# Patient Record
Sex: Female | Born: 1970 | Race: Black or African American | Hispanic: No | Marital: Single | Smoking: Current every day smoker
Health system: Southern US, Community
[De-identification: ages and names within clinical notes are randomized; demographics above are authoritative.]

## PROBLEM LIST (undated history)

## (undated) DIAGNOSIS — R87619 Unspecified abnormal cytological findings in specimens from cervix uteri: Secondary | ICD-10-CM

## (undated) DIAGNOSIS — R32 Unspecified urinary incontinence: Secondary | ICD-10-CM

## (undated) DIAGNOSIS — N946 Dysmenorrhea, unspecified: Secondary | ICD-10-CM

## (undated) DIAGNOSIS — E079 Disorder of thyroid, unspecified: Secondary | ICD-10-CM

## (undated) DIAGNOSIS — Z5189 Encounter for other specified aftercare: Secondary | ICD-10-CM

## (undated) DIAGNOSIS — G43909 Migraine, unspecified, not intractable, without status migrainosus: Secondary | ICD-10-CM

## (undated) HISTORY — PX: TONSILLECTOMY: SUR1361

## (undated) HISTORY — PX: COLPOSCOPY: SHX161

## (undated) HISTORY — DX: Migraine, unspecified, not intractable, without status migrainosus: G43.909

## (undated) HISTORY — DX: Unspecified abnormal cytological findings in specimens from cervix uteri: R87.619

## (undated) HISTORY — DX: Encounter for other specified aftercare: Z51.89

## (undated) HISTORY — PX: TUBAL LIGATION: SHX77

## (undated) HISTORY — DX: Unspecified urinary incontinence: R32

## (undated) HISTORY — DX: Disorder of thyroid, unspecified: E07.9

## (undated) HISTORY — DX: Dysmenorrhea, unspecified: N94.6

## (undated) HISTORY — PX: DILATION AND CURETTAGE OF UTERUS: SHX78

---
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
## 1998-08-07 HISTORY — PX: WISDOM TOOTH EXTRACTION: SHX21

## 1999-04-24 ENCOUNTER — Emergency Department (HOSPITAL_COMMUNITY): Admission: EM | Admit: 1999-04-24 | Discharge: 1999-04-24 | Payer: Self-pay | Admitting: Emergency Medicine

## 2001-02-10 ENCOUNTER — Encounter: Payer: Self-pay | Admitting: Obstetrics

## 2001-02-10 ENCOUNTER — Ambulatory Visit (HOSPITAL_COMMUNITY): Admission: AD | Admit: 2001-02-10 | Discharge: 2001-02-10 | Payer: Self-pay | Admitting: *Deleted

## 2001-02-10 ENCOUNTER — Encounter (INDEPENDENT_AMBULATORY_CARE_PROVIDER_SITE_OTHER): Payer: Self-pay | Admitting: Specialist

## 2001-03-29 ENCOUNTER — Emergency Department (HOSPITAL_COMMUNITY): Admission: EM | Admit: 2001-03-29 | Discharge: 2001-03-29 | Payer: Self-pay

## 2002-03-10 ENCOUNTER — Emergency Department (HOSPITAL_COMMUNITY): Admission: EM | Admit: 2002-03-10 | Discharge: 2002-03-10 | Payer: Self-pay | Admitting: Emergency Medicine

## 2002-03-10 ENCOUNTER — Encounter: Payer: Self-pay | Admitting: Emergency Medicine

## 2002-05-06 ENCOUNTER — Encounter: Payer: Self-pay | Admitting: Emergency Medicine

## 2002-05-06 ENCOUNTER — Emergency Department (HOSPITAL_COMMUNITY): Admission: EM | Admit: 2002-05-06 | Discharge: 2002-05-06 | Payer: Self-pay | Admitting: Emergency Medicine

## 2003-12-03 ENCOUNTER — Ambulatory Visit (HOSPITAL_COMMUNITY): Admission: RE | Admit: 2003-12-03 | Discharge: 2003-12-03 | Payer: Self-pay | Admitting: Obstetrics & Gynecology

## 2004-10-25 ENCOUNTER — Emergency Department (HOSPITAL_COMMUNITY): Admission: EM | Admit: 2004-10-25 | Discharge: 2004-10-25 | Payer: Self-pay | Admitting: Emergency Medicine

## 2006-01-28 ENCOUNTER — Emergency Department (HOSPITAL_COMMUNITY): Admission: EM | Admit: 2006-01-28 | Discharge: 2006-01-28 | Payer: Self-pay | Admitting: Family Medicine

## 2006-01-31 ENCOUNTER — Inpatient Hospital Stay (HOSPITAL_COMMUNITY): Admission: EM | Admit: 2006-01-31 | Discharge: 2006-02-03 | Payer: Self-pay | Admitting: Family Medicine

## 2006-02-04 ENCOUNTER — Emergency Department (HOSPITAL_COMMUNITY): Admission: EM | Admit: 2006-02-04 | Discharge: 2006-02-04 | Payer: Self-pay | Admitting: Family Medicine

## 2007-11-16 ENCOUNTER — Emergency Department (HOSPITAL_COMMUNITY): Admission: EM | Admit: 2007-11-16 | Discharge: 2007-11-16 | Payer: Self-pay | Admitting: Emergency Medicine

## 2008-01-27 ENCOUNTER — Ambulatory Visit (HOSPITAL_COMMUNITY): Admission: RE | Admit: 2008-01-27 | Discharge: 2008-01-27 | Payer: Self-pay | Admitting: Obstetrics and Gynecology

## 2009-10-21 ENCOUNTER — Emergency Department (HOSPITAL_COMMUNITY): Admission: EM | Admit: 2009-10-21 | Discharge: 2009-10-21 | Payer: Self-pay | Admitting: Family Medicine

## 2009-11-30 ENCOUNTER — Ambulatory Visit: Payer: Self-pay | Admitting: Family Medicine

## 2009-11-30 ENCOUNTER — Telehealth (INDEPENDENT_AMBULATORY_CARE_PROVIDER_SITE_OTHER): Payer: Self-pay | Admitting: *Deleted

## 2010-09-06 NOTE — Progress Notes (Signed)
Summary: no show for new pt appt  Phone Note Outgoing Call   Summary of Call: no show for new pt appt Initial call taken by: Raechel Ache, RN,  November 30, 2009 1:58 PM  Follow-up for Phone Call        please charge the NS fee Follow-up by: Nelwyn Salisbury MD,  December 01, 2009 8:27 AM  Additional Follow-up for Phone Call Additional follow up Details #1::        Billed no show.Melissa Hubbard  Dec 27, 2009 9:19 AM

## 2010-12-23 NOTE — Discharge Summary (Signed)
NAME:  Melissa Hubbard, Melissa Hubbard            ACCOUNT NO.:  [id number]   MEDICAL RECORD NO.:  [id number]          PATIENT TYPE:  INP   LOCATION:  6712                         FACILITY:  [facility]   PHYSICIAN:  Deirdre Peer. Polite, M.D. DATE OF BIRTH:  July 19, 1971   DATE OF ADMISSION:  01/31/2006  DATE OF DISCHARGE:  02/03/2006                                 DISCHARGE SUMMARY   DISCHARGE DIAGNOSES:  1.  Left foot cellulitis with lymphangitis, improved post intravenous      antibiotics.  Will continue Levaquin at home.  2.  Hives, rule out penicillin allergy, improved post Benadryl.  3.  Obesity.   DISCHARGE MEDICATIONS:  1.  Levaquin 500 mg daily x 7 days.  2.  Benadryl 25 mg q.6h. p.r.n. hives.   DISPOSITION:  The patient is being discharged to home in stable condition.  Is to follow up with primary MD in 1 week.   STUDIES:  Blood cultures negative x 2, CBC and BMP within normal limits.  TSH within normal limits.   HISTORY OF PRESENT ILLNESS:  This is a 40 year old female who failed  outpatient treatment for localized cellulitis of the left foot around the  great toe, where she has a blister.  The patient had been treated with Cipro  as an outpatient.  Despite Cipro, symptoms went on to worsen.  The patient  was directly admitted to the hospital for further evaluation and treatment  because of the lymphangitic spread.  Please see dictated H and P for further  details.   PAST MEDICAL HISTORY:  As stated above.   MEDICATIONS ON ADMISSION:  Per admission H and P.   SOCIAL HISTORY:  Positive for 10 cigarettes a day.  No alcohol, no drugs.   FAMILY HISTORY:  Noncontributory.   HOSPITAL COURSE:  The patient was admitted to a medicine floor for  evaluation and treatment of cellulitis with lymphangitic spread to the left  leg.  The patient was started empirically on antibiotics with vancomycin and  Zosyn.  The patient had labs drawn.  CBC was within normal limits.  Blood  cultures negative to  date.  The patient remained without fever or  leukocytosis throughout this hospitalization.  The patient had slow  resolution over the initial 24 hours with rapid and nearly complete response  on today's date, June 30.  The patient did have complications with hives  which seemed to happen after her Zosyn had been infused.  The patient was  given Benadryl with rapid response of hives.  The patient was without  shortness of breath or wheeze, and no obvious hives at this time.  Clinically, erythema has nearly completely resolved.  There is no tenderness  or pain of the medial aspect of her leg,  which had been present prior to admission.  There is no ulcer or discharge  from the patient's great toe where her blister had been.  At this time, the  patient is clinically stable for discharge to home.  The patient has been  made aware of POSSIBLE PENICILLIN ALLERGY.      Deirdre Peer. Polite, M.D.  Electronically  Signed     RDP/MEDQ  D:  02/03/2006  T:  02/03/2006  Job:  [id number]

## 2010-12-23 NOTE — Op Note (Signed)
NAME:  Melissa Hubbard, Melissa Hubbard                      ACCOUNT NO.:  [id number]   MEDICAL RECORD NO.:  [id number]                   PATIENT TYPE:  AMB   LOCATION:  SDC                                  FACILITY:  [facility]   PHYSICIAN:  Roseanna Rainbow, M.D.         DATE OF BIRTH:  Dec 30, 1970   DATE OF PROCEDURE:  12/03/2003  DATE OF DISCHARGE:                                 OPERATIVE REPORT   PREOPERATIVE DIAGNOSIS:  Multiparity, desires sterilization procedure.   POSTOPERATIVE DIAGNOSIS:  Multiparity, desires sterilization procedure.   PROCEDURE:  Laparoscopic bilateral tubal ligation with bipolar cautery.   SURGEON:  Roseanna Rainbow, M.D.   ANESTHESIA:  General endotracheal anesthesia.   COMPLICATIONS:  None.   ESTIMATED BLOOD LOSS:  Less than 50 mL.   IV FLUID:  As per anesthesiology.   URINE OUTPUT:  200 mL at the beginning of the procedure.   DESCRIPTION OF PROCEDURE:  The patient was taken to the operating room with  an V running.  She was given general anesthesia and then prepped and draped  in the dorsal lithotomy position in the usual sterile fashion.  An  infraumbilical skin incision  was then made with the scalpel.  The Veress  needle was then introduced into the peritoneal cavity at a 45 degree angle  while tenting up the abdominal wall.  Intra-abdominal placement was  confirmed by saline drop test and appropriate CO2 pressure.  The abdomen was  then insufflated with CO2 gas.  The trocar was then advanced into the  peritoneal cavity where intra-abdominal placement was confirmed by the  laparoscope.  The mid isthmic portion of the right fallopian tube was then  cauterized continuously.  With each application, ohmmeter was noted to go to  zero.  The left fallopian tube was manipulated in a similar fashion.  The  survey of the pelvic viscera demonstrated a normal anatomy.  All the  instruments were then removed from the abdomen.  The skin was reapproximated  with 3-0  Vicryl and Dermabond.  The instruments were removed from the  vagina.   Please note that prior to the incision, a bivalve speculum was placed in the  patient's vagina.  The anterior lip of the cervix was grasped with a single-  tooth tenaculum and a Hulka probe and tenaculum were advanced into the  uterine cavity and secured to the anterior lip of the cervix as a means to  manipulate the uterus.  At the close of the procedure, the instrument and  pad counts were said to be correct x2.  The patient was taken to the PACU  awake and in stable condition.                                              Roseanna Rainbow, M.D.   Judee Clara  D:  12/03/2003  T:  12/03/2003  Job:  [id number]

## 2010-12-23 NOTE — Op Note (Signed)
[facility]  Patient:    Melissa Hubbard, CAHN                     MRN: [id number] Proc. Date: 02/11/01 Adm. Date:  [id number] Attending:  Tammi Sou CC:         [facility]   Operative Report  PREOPERATIVE DIAGNOSIS:       Incomplete abortion.  POSTOPERATIVE DIAGNOSIS:      Incomplete abortion.  PROCEDURE:                    Suction evacuation and curettage.  SURGEON:                      Charles A. Clearance Coots, M.D.  ANESTHESIA:                   MAC with paracervical block.  ESTIMATED BLOOD LOSS:         200 ml.  COMPLICATIONS:                None.  SPECIMENS:                    Products of conception.  DESCRIPTION OF PROCEDURE:     The patient was brought to the operating room where, after satisfactory IV sedation, the legs were brought up in stirrups and the vagina was prepped and draped in the usual sterile fashion.  The urinary bladder was emptied of approximately 100 cc of clear urine.  Bimanual examination revealed the uterus to be about 12-14 weeks in size and midposition.  A sterile speculum was inserted into the vaginal vault and the cervix was isolated.  The cervix was noted to be open and products of conception were in the cervical os.  The anterior lip of the cervix was grasped with a single-tooth tenaculum and paracervical block of approximately 20 ml of 2% Xylocaine was injected, 10 ml in each lateral fornix at the 3 and 9 oclock positions.  A small ring forceps was then introduced into the uterine cavity and products of conception were removed and submitted to pathology for evaluation.  A #12 suction catheter was then easily introduced into the uterine cavity and the remainder of the products of conception were extracted and submitted to pathology for evaluation.  The uterus contracted down well and, on curettage with a small sharp curet, the endometrial surface was gritty all the way around.   There was no active bleeding at the conclusion of the procedure.  All instruments were retired.  The patient tolerated the procedure well and was transported to the recovery room in satisfactory condition. DD:  02/11/01 TD:  02/11/01 Job: [id number]

## 2010-12-23 NOTE — H&P (Signed)
NAME:  Melissa, Hubbard            ACCOUNT NO.:  [id number]   MEDICAL RECORD NO.:  [id number]          PATIENT TYPE:  INP   LOCATION:  6712                         FACILITY:  [facility]   PHYSICIAN:  Corinna L. Lendell Caprice, MDDATE OF BIRTH:  Jan 02, 1971   DATE OF ADMISSION:  01/31/2006  DATE OF DISCHARGE:                                HISTORY & PHYSICAL   CHIEF COMPLAINT:  Foot infection.   HISTORY OF PRESENT ILLNESS:  Melissa Hubbard is a pleasant unassigned 40-year-  old black female who was being treated with ciprofloxacin for the past 3  days for a cellulitis involving her left foot.  She reports that she had a  blister medial to the left great toe which subsequently popped and the  surrounding area became more inflamed and red.  It was painful.  She was  [facility] and given a prescription for  ciprofloxacin.  She had a recheck today and the cellulitis is worse.  It has  also spread up her calf and medial thigh.   She has no significant past medical history.   MEDICATIONS:  Vicodin as needed and ciprofloxacin.   SOCIAL HISTORY:  The patient smokes 10 cigarettes a day.  She works in  Clinical biochemist.  And, no drinking or drug history.   FAMILY HISTORY:  Noncontributory.   REVIEW OF SYSTEMS:  CONSTITUTIONAL:  She has gained 50 pounds over the past  6 months without a change in her diet or activity.  She has had no fevers or  chills.  HEENT:  No headaches, sore throat.  RESPIRATORY:  No cough or  shortness of breath.  CARDIOVASCULAR:  No chest pains.  GI:  She had a bout  of nausea, vomiting, and diarrhea 2 weeks ago that lasted 2 days.  GU:  No  dysuria.  MUSCULOSKELETAL:  No arthralgias or myalgias.  SKIN:  No rash.  PSYCHIATRIC:  No depression.  NEUROLOGIC:  No history of seizures.  ENDOCRINE:  No diabetes.   PHYSICAL EXAMINATION:  VITAL SIGNS:  Her temperature is 98.8, pulse 71,  respiratory rate 20, blood pressure 120/77, oxygen saturation 98% on  room  air.  GENERAL:  The patient is an overweight black female in no acute distress.  HEENT:  Normocephalic, atraumatic.  Pupils equal, round, reactive to light.  Sclerae nonicteric.  Moist mucous membranes.  NECK:  Supple.  She does have some mild thyroid fullness/thyromegaly.  LUNGS:  Clear to auscultation bilaterally without wheezes, rhonchi or rales.  CARDIOVASCULAR:  Regular rate and rhythm without murmurs, gallops or rubs.  ABDOMEN:  Soft, nontender, nondistended.  GU/RECTAL:  Deferred.  EXTREMITIES:  No clubbing or cyanosis.  She has some crusting and  desquamation involving the medial aspect of the left great toe.  She has  erythema and induration mainly on the dorsal surface near the toes.  She has  patchy erythema without much induration but there is tenderness and warmth  tracking up along her medial foot, calf, knee, and thigh up to the groin.   LABORATORY:  Pending.   ASSESSMENT/PLAN:  1.  Cellulitis,  failed outpatient therapy.  Methicillin-resistant staph      aureus is a consideration.  I will start intravenous vancomycin for now.      I will also start Zosyn.  I will await her C-MET and CBC.  2.  Mild thyromegaly with recent weight gain.  I will check TSH.  3.  History of bilateral tubal ligation.      Corinna L. Lendell Caprice, MD  Electronically Signed     CLS/MEDQ  D:  01/31/2006  T:  01/31/2006  Job:  [id number]

## 2011-06-26 ENCOUNTER — Encounter: Payer: Self-pay | Admitting: Emergency Medicine

## 2011-06-26 ENCOUNTER — Emergency Department (HOSPITAL_COMMUNITY)
Admission: EM | Admit: 2011-06-26 | Discharge: 2011-06-26 | Disposition: A | Discharge status: Home / Self Care | Payer: Medicaid Other | Source: Home / Self Care | Attending: Family Medicine | Admitting: Family Medicine

## 2011-06-26 DIAGNOSIS — L02413 Cutaneous abscess of right upper limb: Secondary | ICD-10-CM

## 2011-06-26 DIAGNOSIS — IMO0002 Reserved for concepts with insufficient information to code with codable children: Secondary | ICD-10-CM

## 2011-06-26 MED ORDER — HYDROCODONE-ACETAMINOPHEN 5-325 MG PO TABS: ORAL_TABLET | ORAL | Status: AC

## 2011-06-26 MED ORDER — SULFAMETHOXAZOLE-TRIMETHOPRIM 800-160 MG PO TABS: 1.0000 | ORAL_TABLET | Freq: Two times a day (BID) | ORAL | Status: AC

## 2011-06-26 NOTE — ED Notes (Signed)
Pt here with right medial forearm boil that started with small pimple x 5 dys ago now tender,reddened and painful.temp on arrival 60.5

## 2011-06-26 NOTE — ED Provider Notes (Signed)
History     CSN: [id number] Arrival date & time: 06/26/2011 10:33 AM   First MD Initiated Contact with Patient 06/26/11 1003      Chief Complaint  Patient presents with  . Recurrent Skin Infections    (Consider location/radiation/quality/duration/timing/severity/associated sxs/prior treatment) Patient is a 40 y.o. female presenting with rash. The history is provided by the patient.  Rash  This is a recurrent problem. The current episode started more than 2 days ago. The problem has been gradually worsening. The problem is associated with nothing. There has been no fever. The rash is present on the right arm. The pain is moderate. The pain has been constant since onset. She has tried nothing for the symptoms.    History reviewed. No pertinent past medical history.  Past Surgical History  Procedure Date  . Tonsillectomy   . Tubal ligation     No family history on file.  History  Substance Use Topics  . Smoking status: Current Everyday Smoker  . Smokeless tobacco: Not on file  . Alcohol Use: Yes    OB History    Grav Para Term Preterm Abortions TAB SAB Ect Mult Living                  Review of Systems  Constitutional: Negative.   HENT: Negative.   Eyes: Negative.   Respiratory: Negative.   Cardiovascular: Negative.   Gastrointestinal: Negative.   Genitourinary: Negative.   Musculoskeletal: Negative.   Skin: Negative for rash.       Abscess on dorsum of RIGHT forearm  Neurological: Negative.     Allergies  Penicillins  Home Medications  No current outpatient prescriptions on file.  BP 109/72  Pulse 62  Temp(Src) 99.5 F (37.5 C) (Tympanic)  Resp 16  SpO2 99%  LMP 06/04/2011  Physical Exam  Constitutional: She is oriented to person, place, and time. She appears well-developed and well-nourished.  HENT:  Head: Normocephalic and atraumatic.  Eyes: EOM are normal.  Neck: Normal range of motion.  Neurological: She is alert and oriented to person,  place, and time.  Skin: Lesion noted.       ED Course  INCISION AND DRAINAGE Date/Time: 06/26/2011 11:39 AM Performed by: Richardo Priest Authorized by: Richardo Priest Consent: Verbal consent obtained. Risks and benefits: risks, benefits and alternatives were discussed Consent given by: patient Patient understanding: patient states understanding of the procedure being performed Patient identity confirmed: verbally with patient and arm band Type: abscess Body area: upper extremity Location details: right arm Anesthesia: local infiltration Local anesthetic: lidocaine 2% without epinephrine and topical anesthetic Scalpel size: 11 Needle gauge: #27. Incision type: single straight Complexity: simple Drainage: purulent Wound treatment: wound left open Packing material: none   (including critical care time)  Labs Reviewed - No data to display No results found.   No diagnosis found.    MDM  I&D of abscess on RIGHT forearm        Richardo Priest, MD 06/26/11 1141

## 2011-09-12 ENCOUNTER — Emergency Department (HOSPITAL_COMMUNITY)
Admission: EM | Admit: 2011-09-12 | Discharge: 2011-09-12 | Disposition: A | Discharge status: Home / Self Care | Payer: Medicaid Other | Attending: Emergency Medicine | Admitting: Emergency Medicine

## 2011-09-12 ENCOUNTER — Encounter (HOSPITAL_COMMUNITY): Payer: Self-pay | Admitting: Emergency Medicine

## 2011-09-12 DIAGNOSIS — L298 Other pruritus: Secondary | ICD-10-CM | POA: Insufficient documentation

## 2011-09-12 DIAGNOSIS — L509 Urticaria, unspecified: Secondary | ICD-10-CM | POA: Insufficient documentation

## 2011-09-12 DIAGNOSIS — R21 Rash and other nonspecific skin eruption: Secondary | ICD-10-CM | POA: Insufficient documentation

## 2011-09-12 DIAGNOSIS — T7840XA Allergy, unspecified, initial encounter: Secondary | ICD-10-CM | POA: Insufficient documentation

## 2011-09-12 DIAGNOSIS — L2989 Other pruritus: Secondary | ICD-10-CM | POA: Insufficient documentation

## 2011-09-12 DIAGNOSIS — I10 Essential (primary) hypertension: Secondary | ICD-10-CM | POA: Insufficient documentation

## 2011-09-12 DIAGNOSIS — L259 Unspecified contact dermatitis, unspecified cause: Secondary | ICD-10-CM

## 2011-09-12 MED ORDER — DIPHENHYDRAMINE HCL 25 MG PO CAPS
50.0000 mg | ORAL_CAPSULE | Freq: Once | ORAL | Status: AC
  Administered 2011-09-12: 50 mg via ORAL
  Filled 2011-09-12: qty 2

## 2011-09-12 MED ORDER — PREDNISONE 20 MG PO TABS: 40.0000 mg | ORAL_TABLET | Freq: Every day | ORAL | Status: AC

## 2011-09-12 MED ORDER — PREDNISONE 20 MG PO TABS
60.0000 mg | ORAL_TABLET | Freq: Once | ORAL | Status: AC
  Administered 2011-09-12: 60 mg via ORAL
  Filled 2011-09-12: qty 3

## 2011-09-12 NOTE — ED Notes (Signed)
Pt thinks she is having allergic reaction to something but does not know what, st's woke up this am with right ear swollen, also c/o itching on upper back.  No resp. Problems noted.

## 2011-09-12 NOTE — ED Provider Notes (Signed)
Medical screening examination/treatment/procedure(s) were performed by non-physician practitioner and as supervising physician I was immediately available for consultation/collaboration.   Loren Racer, MD 09/12/11 [phone]

## 2011-09-12 NOTE — ED Provider Notes (Signed)
History     CSN: [id number]  Arrival date & time 09/12/11  2022   First MD Initiated Contact with Patient 09/12/11 2237      Chief Complaint  Patient presents with  . Allergic Reaction    (Consider location/radiation/quality/duration/timing/severity/associated sxs/prior treatment) HPI Comments: Patient here with itchy papular rash to scalp, behind both ears and down back - she denies any new shampoos, hair products, relaxers, denies shortness of breath, chest tightness, tongue swelling, difficulty swallowing.  Patient is a 41 y.o. female presenting with allergic reaction. The history is provided by the patient. No language interpreter was used.  Allergic Reaction The primary symptoms are  rash and urticaria. The primary symptoms do not include wheezing, shortness of breath, cough, abdominal pain, nausea, vomiting, diarrhea, palpitations, altered mental status or angioedema. The current episode started yesterday. The problem has been gradually worsening. This is a new problem.  The rash is associated with itching.  Significant symptoms also include itching. Significant symptoms that are not present include eye redness, flushing or rhinorrhea.    History reviewed. No pertinent past medical history.  Past Surgical History  Procedure Date  . Tonsillectomy   . Tubal ligation   . Dilation and curettage of uterus     No family history on file.  History  Substance Use Topics  . Smoking status: Current Everyday Smoker  . Smokeless tobacco: Not on file  . Alcohol Use: Yes    OB History    Grav Para Term Preterm Abortions TAB SAB Ect Mult Living                  Review of Systems  HENT: Negative for rhinorrhea.   Eyes: Negative for redness.  Respiratory: Negative for cough, shortness of breath and wheezing.   Cardiovascular: Negative for chest pain and palpitations.  Gastrointestinal: Negative for nausea, vomiting, abdominal pain and diarrhea.  Skin: Positive for itching and  rash. Negative for flushing.  Psychiatric/Behavioral: Negative for altered mental status.  All other systems reviewed and are negative.    Allergies  Penicillins  Home Medications   Current Outpatient Rx  Name Route Sig Dispense Refill  . ADULT MULTIVITAMIN W/MINERALS CH Oral Take 1 tablet by mouth daily.      BP 121/79  Pulse 92  Temp(Src) 99.4 F (37.4 C) (Oral)  Resp 20  SpO2 97%  LMP 08/28/2011  Physical Exam  Nursing note and vitals reviewed. Constitutional: She is oriented to person, place, and time. She appears well-developed and well-nourished. No distress.  HENT:  Head: Atraumatic.  Right Ear: External ear normal.  Left Ear: External ear normal.  Nose: Nose normal.  Mouth/Throat: Oropharynx is clear and moist.       Papular rash to scalp, bilateral ears  Eyes: Conjunctivae are normal. Pupils are equal, round, and reactive to light. No scleral icterus.  Neck: Normal range of motion. Neck supple.  Cardiovascular: Normal rate, regular rhythm and normal heart sounds.  Exam reveals no gallop and no friction rub.   No murmur heard. Pulmonary/Chest: Effort normal and breath sounds normal. No respiratory distress. She has no wheezes. She has no rales. She exhibits no tenderness.  Abdominal: Soft. Bowel sounds are normal. She exhibits no distension. There is no tenderness.  Musculoskeletal: Normal range of motion. She exhibits no edema and no tenderness.  Lymphadenopathy:    She has no cervical adenopathy.  Neurological: She is alert and oriented to person, place, and time. No cranial nerve deficit.  Skin:  Skin is warm and dry. Rash noted.  Psychiatric: She has a normal mood and affect. Her behavior is normal. Judgment and thought content normal.    ED Course  Procedures (including critical care time)  Labs Reviewed - No data to display No results found.   Contact dermatitis     MDM  Patient with contact dermatitis to scalp and back likely due to hair  product, will give steriods and antihistamines - patient instructed to change hair products.        Izola Price [place], [place] 09/12/11 2252

## 2011-09-12 NOTE — ED Notes (Signed)
PT. REPORTS ITCHY RASHES AT BOTH EARS,FOREHEAD AND BACK ONSET THIS MORNING , RESPIRATIONS UNLABORED , AIRWAY INTACT , DENIES NEW  MEDICATIONS / FOOD OR DETERGENT.

## 2011-11-02 ENCOUNTER — Emergency Department (HOSPITAL_COMMUNITY)
Admission: EM | Admit: 2011-11-02 | Discharge: 2011-11-02 | Disposition: A | Discharge status: Home / Self Care | Payer: Medicaid Other | Attending: Emergency Medicine | Admitting: Emergency Medicine

## 2011-11-02 ENCOUNTER — Encounter (HOSPITAL_COMMUNITY): Payer: Self-pay | Admitting: Physical Medicine and Rehabilitation

## 2011-11-02 DIAGNOSIS — L03221 Cellulitis of neck: Secondary | ICD-10-CM | POA: Insufficient documentation

## 2011-11-02 DIAGNOSIS — L0211 Cutaneous abscess of neck: Secondary | ICD-10-CM | POA: Insufficient documentation

## 2011-11-02 MED ORDER — OXYCODONE-ACETAMINOPHEN 5-325 MG PO TABS
1.0000 | ORAL_TABLET | Freq: Once | ORAL | Status: AC
  Administered 2011-11-02: 1 via ORAL
  Filled 2011-11-02: qty 1

## 2011-11-02 MED ORDER — DOXYCYCLINE HYCLATE 100 MG PO CAPS: 100.0000 mg | ORAL_CAPSULE | Freq: Two times a day (BID) | ORAL | Status: DC

## 2011-11-02 MED ORDER — HYDROCODONE-ACETAMINOPHEN 5-325 MG PO TABS: 1.0000 | ORAL_TABLET | Freq: Four times a day (QID) | ORAL | Status: DC | PRN

## 2011-11-02 NOTE — Discharge Instructions (Signed)
Return here in days for a recheck. Use heat around the area. Keep area covered.

## 2011-11-02 NOTE — ED Provider Notes (Signed)
Medical screening examination/treatment/procedure(s) were conducted as a shared visit with non-physician practitioner(s) and myself.  I personally evaluated the patient during the encounter.  The patient was seen along with C. Lawyer and I agree with his note, assessment, and plan.  The patient presents complaining of swelling and pain over the right lower occipital scalp for several days.  On exam, the patient is in no distress.  There is swollen, firm, ttp area of the posterior inferior occipital area consistent with an abscess.  There is also slight extension to the right side of the neck.  An I and D was performed by C. Lawyer.  Please see his note.  She was prescribed antibiotics and will be rechecked in 2 days.  Geoffery Lyons, MD 11/02/11 [phone]

## 2011-11-02 NOTE — ED Notes (Signed)
PA at bedside.

## 2011-11-02 NOTE — ED Notes (Signed)
Pt presents to department for evaluation of neck pain. States she woke up with raised area to posterior neck. Area is warm and tender to touch. Also states rash to neck. No drainage noted. She is alert and oriented x4. 4/10 pain at the time. Ambulatory to triage.

## 2011-11-02 NOTE — ED Provider Notes (Signed)
History     CSN: [id number]  Arrival date & time 11/02/11  0830   First MD Initiated Contact with Patient 11/02/11 [phone]      Chief Complaint  Patient presents with  . Neck Pain  . Abscess    (Consider location/radiation/quality/duration/timing/severity/associated sxs/prior treatment) HPI Melissa Hubbard is a 41 yo female who presents to the ED today with complaints of an abscess and neck swelling.  Pt states she noticed a bump on her neck, just below her hairline on the right side, the day before yesterday, which then progressed to a bigger spot yesterday.  When she awoke this morning she noticed her right lateral neck was swollen and the place in her hairline was more tender.  She has had an abscess on her right arm before, and has never seen a Careers adviser.  She denies any N/V/D, fever, chills, SOB, chest pain.    No past medical history on file.  Past Surgical History  Procedure Date  . Tonsillectomy   . Tubal ligation   . Dilation and curettage of uterus     No family history on file.  History  Substance Use Topics  . Smoking status: Current Everyday Smoker    Types: Cigarettes  . Smokeless tobacco: Not on file  . Alcohol Use: Yes    OB History    Grav Para Term Preterm Abortions TAB SAB Ect Mult Living                  Review of Systems All pertinent positives and negatives reviewed in the history of present illness  Allergies  Penicillins  Home Medications   Current Outpatient Rx  Name Route Sig Dispense Refill  . ADULT MULTIVITAMIN W/MINERALS CH Oral Take 1 tablet by mouth daily.      BP 127/80  Pulse 81  Temp(Src) 98.3 F (36.8 C) (Oral)  Resp 20  SpO2 100%  Physical Exam  Constitutional: She is oriented to person, place, and time. She appears well-developed and well-nourished. No distress.  HENT:  Head:    Neck: No tracheal deviation present. No thyromegaly present.  Cardiovascular: Normal rate, regular rhythm, normal heart sounds and intact  distal pulses.   No murmur heard. Pulmonary/Chest: Effort normal and breath sounds normal. No respiratory distress. She has no wheezes.  Lymphadenopathy:    She has no cervical adenopathy.  Neurological: She is alert and oriented to person, place, and time.  Skin: Skin is warm. Rash (skin colored papules across posterior neck and behind left ear) noted. She is not diaphoretic. There is erythema (posterior and lateral right neck).    ED Course  Procedures (including critical care time)  Labs Reviewed - No data to display No results found.  Pt seen and assessed.  No complaints of SOB or chest pain.  Vitals are stable.   10:23 AM Pt aware of plan to I&D area.  Still stable.  Advised to follow up with dermatologist, which pt states she knows one she can go to, for continued itching and irritation of skin.  INCISION AND DRAINAGE Performed by: Carlyle Dolly Consent: Verbal consent obtained. Risks and benefits: risks, benefits and alternatives were discussed Type: abscess  Body area: Posterior neck at hairline on right side  Anesthesia: local infiltration  Local anesthetic: lidocaine 2% with epinephrine  Anesthetic total: 4 ml  Complexity: complex Blunt dissection to break up loculations  Drainage: blood  Drainage amount: small  Packing material: 1/4 in iodoform gauze  Patient tolerance: Patient tolerated  the procedure well with no immediate complications.   Patient is told to return here in 2 days for a recheck. Told to use heat around the area.  MDM          Carlyle Dolly, PA-C 11/02/11 1100

## 2011-11-02 NOTE — ED Notes (Signed)
Patient states one day ago bump unknown reason developed right posterior head near hairline and woke up today with increased swelling right posterior neck light pink.

## 2011-11-04 ENCOUNTER — Emergency Department (HOSPITAL_COMMUNITY)
Admission: EM | Admit: 2011-11-04 | Discharge: 2011-11-04 | Disposition: A | Discharge status: Home / Self Care | Payer: Medicaid Other | Attending: Emergency Medicine | Admitting: Emergency Medicine

## 2011-11-04 ENCOUNTER — Encounter (HOSPITAL_COMMUNITY): Payer: Self-pay

## 2011-11-04 DIAGNOSIS — L0211 Cutaneous abscess of neck: Secondary | ICD-10-CM

## 2011-11-04 DIAGNOSIS — F17200 Nicotine dependence, unspecified, uncomplicated: Secondary | ICD-10-CM | POA: Insufficient documentation

## 2011-11-04 DIAGNOSIS — L03221 Cellulitis of neck: Secondary | ICD-10-CM | POA: Insufficient documentation

## 2011-11-04 NOTE — ED Provider Notes (Signed)
History     CSN: [id number]  Arrival date & time 11/04/11  1705   First MD Initiated Contact with Patient 11/04/11 1851      Chief Complaint  Patient presents with  . Wound Check    (Consider location/radiation/quality/duration/timing/severity/associated sxs/prior treatment) HPI Comments: Patient presents for a 2 day recheck of an abscess at the base of the posterior side of her right neck. It was incised and drained 2 days ago. She had had swelling and pain in that area for several days prior. Patient denies fever or chills. She denies severe headache or neck stiffness. She reports that the swelling has improved markedly since it has been draining. She was given Norco for pain and doxycycline antibiotics.  Patient is a 41 y.o. female presenting with wound check. The history is provided by the patient and medical records.  Wound Check     History reviewed. No pertinent past medical history.  Past Surgical History  Procedure Date  . Tonsillectomy   . Tubal ligation   . Dilation and curettage of uterus     History reviewed. No pertinent family history.  History  Substance Use Topics  . Smoking status: Current Everyday Smoker    Types: Cigarettes  . Smokeless tobacco: Not on file  . Alcohol Use: Yes    OB History    Grav Para Term Preterm Abortions TAB SAB Ect Mult Living                  Review of Systems  Constitutional: Negative for fever and chills.  HENT: Positive for neck pain. Negative for neck stiffness.   Musculoskeletal: Negative for back pain.  Skin: Positive for wound.  Neurological: Negative for headaches.    Allergies  Penicillins  Home Medications   Current Outpatient Rx  Name Route Sig Dispense Refill  . DOXYCYCLINE HYCLATE 100 MG PO CAPS Oral Take 100 mg by mouth 2 (two) times daily.    Marland Kitchen HYDROCODONE-ACETAMINOPHEN 5-325 MG PO TABS Oral Take 1 tablet by mouth every 6 (six) hours as needed. For pain    . ADULT MULTIVITAMIN W/MINERALS CH Oral  Take 1 tablet by mouth daily.      BP 116/76  Pulse 87  Temp(Src) 99.8 F (37.7 C) (Oral)  Resp 20  SpO2 94%  Physical Exam  Nursing note and vitals reviewed. Constitutional: She appears well-developed and well-nourished.  HENT:  Head: Normocephalic.  Neck: Full passive range of motion without pain. No rigidity.         Site is mildly tender and there is some posterior cervical adenopathy on the right side.    ED Course  Procedures (including critical care time)  Labs Reviewed - No data to display No results found.   1. Abscess of neck       MDM   Patient reports pain and swelling is improved. There is area of induration, however no obvious fluctuance. I suspect there is reactive process still occurring as well as some degree of cellulitis. She still has about 5 days of doxycycline left in her estimation. She reports that she has a primary care physician that she can followup with. Otherwise I recommend that she come back here next week if she is unable to followup for recheck. Worst-case scenario, the patient may require further incision or possibly general surgical referral if things are not resolving in a timely fashion. The patient is agreeable with this plan and disposition to home.  Gavin Pound. Oletta Lamas, MD 11/04/11 1610

## 2011-11-04 NOTE — ED Notes (Signed)
Ing removal to an abscess on her rt. Posterior neck/hairline.  Pt. Reports that the swelling is going down, denies pain

## 2011-11-04 NOTE — Discharge Instructions (Signed)

## 2012-03-05 ENCOUNTER — Other Ambulatory Visit: Payer: Self-pay | Admitting: Family Medicine

## 2012-03-05 DIAGNOSIS — N63 Unspecified lump in unspecified breast: Secondary | ICD-10-CM

## 2012-03-07 ENCOUNTER — Ambulatory Visit
Admission: RE | Admit: 2012-03-07 | Discharge: 2012-03-07 | Disposition: A | Discharge status: Home / Self Care | Payer: Medicaid Other | Source: Ambulatory Visit | Attending: Family Medicine | Admitting: Family Medicine

## 2012-03-07 DIAGNOSIS — N63 Unspecified lump in unspecified breast: Secondary | ICD-10-CM

## 2013-10-05 ENCOUNTER — Emergency Department (HOSPITAL_COMMUNITY): Payer: BC Managed Care – PPO

## 2013-10-05 ENCOUNTER — Encounter (HOSPITAL_COMMUNITY): Payer: Self-pay | Admitting: Emergency Medicine

## 2013-10-05 ENCOUNTER — Emergency Department (HOSPITAL_COMMUNITY)
Admission: EM | Admit: 2013-10-05 | Discharge: 2013-10-05 | Disposition: A | Discharge status: Home / Self Care | Payer: BC Managed Care – PPO | Attending: Emergency Medicine | Admitting: Emergency Medicine

## 2013-10-05 DIAGNOSIS — F17200 Nicotine dependence, unspecified, uncomplicated: Secondary | ICD-10-CM | POA: Insufficient documentation

## 2013-10-05 DIAGNOSIS — Z880 Allergy status to penicillin: Secondary | ICD-10-CM | POA: Insufficient documentation

## 2013-10-05 DIAGNOSIS — Z79899 Other long term (current) drug therapy: Secondary | ICD-10-CM | POA: Insufficient documentation

## 2013-10-05 DIAGNOSIS — W010XXA Fall on same level from slipping, tripping and stumbling without subsequent striking against object, initial encounter: Secondary | ICD-10-CM | POA: Insufficient documentation

## 2013-10-05 DIAGNOSIS — W19XXXA Unspecified fall, initial encounter: Secondary | ICD-10-CM

## 2013-10-05 DIAGNOSIS — Y939 Activity, unspecified: Secondary | ICD-10-CM | POA: Insufficient documentation

## 2013-10-05 DIAGNOSIS — IMO0002 Reserved for concepts with insufficient information to code with codable children: Secondary | ICD-10-CM | POA: Insufficient documentation

## 2013-10-05 DIAGNOSIS — Y929 Unspecified place or not applicable: Secondary | ICD-10-CM | POA: Insufficient documentation

## 2013-10-05 DIAGNOSIS — M549 Dorsalgia, unspecified: Secondary | ICD-10-CM

## 2013-10-05 MED ORDER — IBUPROFEN 200 MG PO TABS
600.0000 mg | ORAL_TABLET | Freq: Once | ORAL | Status: AC
  Administered 2013-10-05: 600 mg via ORAL
  Filled 2013-10-05: qty 3

## 2013-10-05 MED ORDER — IBUPROFEN 600 MG PO TABS: 600.0000 mg | ORAL_TABLET | Freq: Three times a day (TID) | ORAL | Status: DC | PRN

## 2013-10-05 MED ORDER — HYDROMORPHONE HCL PF 1 MG/ML IJ SOLN
1.0000 mg | Freq: Once | INTRAMUSCULAR | Status: AC
  Administered 2013-10-05: 1 mg via INTRAMUSCULAR
  Filled 2013-10-05: qty 1

## 2013-10-05 MED ORDER — OXYCODONE-ACETAMINOPHEN 5-325 MG PO TABS: 1.0000 | ORAL_TABLET | ORAL | Status: DC | PRN

## 2013-10-05 MED ORDER — OXYCODONE-ACETAMINOPHEN 5-325 MG PO TABS
1.0000 | ORAL_TABLET | Freq: Once | ORAL | Status: AC
  Administered 2013-10-05: 1 via ORAL
  Filled 2013-10-05: qty 1

## 2013-10-05 NOTE — ED Notes (Signed)
Bed: [id number] Expected date: 10/05/13 Expected time: 7:45 AM Means of arrival: Ambulance Comments: Fall, back pain and shouder/head pain

## 2013-10-05 NOTE — ED Notes (Signed)
Pt returned from xray

## 2013-10-05 NOTE — ED Provider Notes (Signed)
CSN: [id number]     Arrival date & time 10/05/13  0753 History   First MD Initiated Contact with Patient 10/05/13 0759     Chief Complaint  Patient presents with  . Back Pain    post fall      The history is provided by the patient.   This reports slipping on ice today and falling and hitting her steps coming out of her house.  She states pain in her left scapula as well as lumbar and thoracic spine.  She also has sacral discomfort.  She denies weakness of her arms or legs.  She denies neck pain.  No head injury.  No loss consciousness.  No headache at this time.  She denies chest pain shortness of breath.  She denies abdominal pain.  She has no pain to her extremities.  Her pain is mild to moderate in severity.  Her pain is worse with palpation of her back and scapula.   History reviewed. No pertinent past medical history. Past Surgical History  Procedure Laterality Date  . Tonsillectomy    . Tubal ligation    . Dilation and curettage of uterus     No family history on file. History  Substance Use Topics  . Smoking status: Current Every Day Smoker    Types: Cigarettes  . Smokeless tobacco: Never Used  . Alcohol Use: Yes   OB History   Grav Para Term Preterm Abortions TAB SAB Ect Mult Living                 Review of Systems  Musculoskeletal: Positive for back pain.  All other systems reviewed and are negative.      Allergies  Penicillins  Home Medications   Current Outpatient Rx  Name  Route  Sig  Dispense  Refill  . Multiple Vitamin (MULITIVITAMIN WITH MINERALS) TABS   Oral   Take 1 tablet by mouth daily.          BP 131/65  Pulse 75  Temp(Src) 97.9 F (36.6 C) (Oral)  Resp 19  SpO2 99%  LMP 09/26/2013 Physical Exam  Nursing note and vitals reviewed. Constitutional: She is oriented to person, place, and time. She appears well-developed and well-nourished. No distress.  HENT:  Head: Normocephalic and atraumatic.  Eyes: EOM are normal.  Neck:  Normal range of motion.  Cardiovascular: Normal rate, regular rhythm and normal heart sounds.   Pulmonary/Chest: Effort normal and breath sounds normal.  Abdominal: Soft. She exhibits no distension.  Musculoskeletal: Normal range of motion.  Tenderness of thoracic and lumbar spine without step-offs.  Also with parathoracic and paralumbar tenderness.  Mild tenderness of the medial aspect of her left scapula without obvious deformity.  Full range of motion of left shoulder.  No tenderness of left a.c. joint.  Normal grip strength in left hand.  Normal range of motion of left wrist and left elbow and normal left radial pulse  Neurological: She is alert and oriented to person, place, and time.  Skin: Skin is warm and dry.  Psychiatric: She has a normal mood and affect. Judgment normal.    ED Course  Procedures (including critical care time) Labs Review Labs Reviewed - No data to display Imaging Review Dg Thoracic Spine 2 View  10/05/2013   CLINICAL DATA:  Post fall, now with upper back pain  EXAM: THORACIC SPINE - 2 VIEW  COMPARISON:  DG LUMBAR SPINE COMPLETE dated 10/05/2013  FINDINGS: Normal alignment of the thoracic spine.  Thoracic vertebral body heights are preserved.  Intervertebral disc spaces are preserved.  Limited visualization of the adjacent thorax is normal. Regional soft tissues appear normal.  IMPRESSION: No acute findings.   Electronically Signed   By: Simonne Come M.D.   On: 10/05/2013 10:48   Dg Lumbar Spine Complete  10/05/2013   CLINICAL DATA:  Post fall, now with low back pain  EXAM: LUMBAR SPINE - COMPLETE 4+ VIEW  COMPARISON:  DG SACRUM/COCCYX dated 10/05/2013  FINDINGS: There are 5 non rib-bearing lumbar type vertebral bodies with partial sacralization of L5. For the purposes of this dictation, lumbar levels to be labeled L1 through L5.  Normal alignment of the lumbar spine. No anterolisthesis or retrolisthesis. No definite pars defects.  Lumbar vertebral body heights are preserved.  There is mild DDD of T11-T12 with disc space height loss, endplate irregularity and sclerosis. Remaining vertebral body heights appear preserved.  Limited visualization the bilateral SI joints is normal. Regional soft tissues appear normal.  IMPRESSION: 1. No acute findings. 2. 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 3. Mild DDD of T11-T12.   Electronically Signed   By: Simonne Come M.D.   On: 10/05/2013 10:48   Dg Sacrum/coccyx  10/05/2013   CLINICAL DATA:  Post fall, now with sacral pain  EXAM: SACRUM AND COCCYX - 2+ VIEW  COMPARISON:  DG LUMBAR SPINE COMPLETE dated 10/05/2013  FINDINGS: No displaced sacral or coccygeal fracture.  Normal appearance of the bilateral SI joints and the pubic symphysis.  Limited visualization the bilateral hips is normal.  Regional soft tissues appear normal.  No radiopaque foreign body.  IMPRESSION: No displaced sacral or coccygeal fracture.   Electronically Signed   By: Simonne Come M.D.   On: 10/05/2013 10:45   Dg Scapula Left  10/05/2013   CLINICAL DATA:  Fall, left shoulder pain  EXAM: LEFT SCAPULA - 2+ VIEWS  COMPARISON:  None.  FINDINGS: There is no evidence of fracture or other focal bone lesions. Soft tissues are unremarkable.  IMPRESSION: Negative.   Electronically Signed   By: Natasha Mead M.D.   On: 10/05/2013 10:22   I personally reviewed the imaging tests through PACS system I reviewed available ER/hospitalization records through the EMR   EKG Interpretation None      MDM   Final diagnoses:  None    11:24 AM Patient feels better at this time.  Discharge home in good condition.  C-spine cleared by Nexus criteria.  No fractures noted.    Lyanne Co, MD 10/05/13 1125

## 2013-10-05 NOTE — ED Notes (Signed)
Pt states she was coming down her front steps when she slipped and fell on ice.  Pt states she landed on her lower back/sacrum.  Pt states she also hit her head, but denies LOC.  No hematoma is noted.  Pt MAE and does not c/o numbness/tingling or nausea.  Pt c/o neck pain and is particularly painful to palpation in her low spine.

## 2013-10-05 NOTE — ED Notes (Signed)
Per EMS - pt fell on icy steps as she left for work today.  Pt landed on her sacrum and states she hit her head.  Pt c/o lumbar pain, left scapula and back of her head.  Pt denies LOC.  Pt denies N/V.  Vitals, 118/88, HR 84, RR 16.  Pt is fully immobilized.

## 2013-10-05 NOTE — ED Notes (Signed)
Pt cleared from LSB by B. Duncan and L. Manson PasseyBrown, RNs.  Pt removed from board with assistance of Mariea ClontsJacob Lennon, NT and TXU CorpShakira Shiver, NT. Collar remained on patient.

## 2014-03-05 ENCOUNTER — Emergency Department (HOSPITAL_COMMUNITY)
Admission: EM | Admit: 2014-03-05 | Discharge: 2014-03-05 | Disposition: A | Discharge status: Home / Self Care | Payer: 59 | Source: Home / Self Care

## 2014-03-05 ENCOUNTER — Encounter (HOSPITAL_COMMUNITY): Payer: Self-pay | Admitting: Emergency Medicine

## 2014-03-05 DIAGNOSIS — G43009 Migraine without aura, not intractable, without status migrainosus: Secondary | ICD-10-CM

## 2014-03-05 MED ORDER — KETOROLAC TROMETHAMINE 60 MG/2ML IM SOLN
INTRAMUSCULAR | Status: AC
  Filled 2014-03-05: qty 2

## 2014-03-05 MED ORDER — METHYLPREDNISOLONE SODIUM SUCC 125 MG IJ SOLR: 125.0000 mg | Freq: Once | INTRAMUSCULAR | Status: DC

## 2014-03-05 MED ORDER — ONDANSETRON 4 MG PO TBDP
4.0000 mg | ORAL_TABLET | Freq: Once | ORAL | Status: AC
  Administered 2014-03-05: 4 mg via ORAL

## 2014-03-05 MED ORDER — METHYLPREDNISOLONE SODIUM SUCC 125 MG IJ SOLR
125.0000 mg | Freq: Once | INTRAMUSCULAR | Status: AC
  Administered 2014-03-05: 125 mg via INTRAMUSCULAR

## 2014-03-05 MED ORDER — ONDANSETRON 4 MG PO TBDP
ORAL_TABLET | ORAL | Status: AC
  Filled 2014-03-05: qty 1

## 2014-03-05 MED ORDER — KETOROLAC TROMETHAMINE 60 MG/2ML IM SOLN
60.0000 mg | Freq: Once | INTRAMUSCULAR | Status: AC
  Administered 2014-03-05: 60 mg via INTRAMUSCULAR

## 2014-03-05 MED ORDER — METHYLPREDNISOLONE SODIUM SUCC 125 MG IJ SOLR
INTRAMUSCULAR | Status: AC
  Filled 2014-03-05: qty 2

## 2014-03-05 NOTE — ED Provider Notes (Signed)
CSN: [id number]     Arrival date & time 03/05/14  0905 History   First MD Initiated Contact with Patient 03/05/14 [phone]     Chief Complaint  Patient presents with  . Headache   (Consider location/radiation/quality/duration/timing/severity/associated sxs/prior Treatment) HPI Comments: 43 year old female complaining of a headache for 3 days. She states this is similar to her past migraine headaches she has been having since age 46.(Migraine headaches are not noted in her past medical history). The pain is located in the bitemporal area and in the eyes. It is associated with photophobia and  vomiting. She has tried Excedrin migraine which has helped in the past but not this episode. She has no PCP.   History reviewed. No pertinent past medical history. Past Surgical History  Procedure Laterality Date  . Tonsillectomy    . Tubal ligation    . Dilation and curettage of uterus     History reviewed. No pertinent family history. History  Substance Use Topics  . Smoking status: Current Every Day Smoker    Types: Cigarettes  . Smokeless tobacco: Never Used  . Alcohol Use: Yes   OB History   Grav Para Term Preterm Abortions TAB SAB Ect Mult Living                 Review of Systems  Constitutional: Positive for activity change. Negative for fever.  Eyes: Positive for photophobia and pain. Negative for discharge, redness, itching and visual disturbance.  Respiratory: Negative.   Cardiovascular: Negative for chest pain and palpitations.  Gastrointestinal: Positive for nausea and vomiting.  Genitourinary: Negative.   Musculoskeletal: Negative.  Negative for gait problem, neck pain and neck stiffness.  Skin: Negative.   Neurological: Positive for headaches. Negative for dizziness, tremors, seizures, syncope, facial asymmetry, speech difficulty, weakness, light-headedness and numbness.  Psychiatric/Behavioral: Negative.     Allergies  Penicillins  Home Medications   Prior to Admission  medications   Medication Sig Start Date End Date Taking? Authorizing Provider  ibuprofen (ADVIL,MOTRIN) 600 MG tablet Take 1 tablet (600 mg total) by mouth every 8 (eight) hours as needed. 10/05/13   Lyanne Co, MD  Multiple Vitamin (MULITIVITAMIN WITH MINERALS) TABS Take 1 tablet by mouth daily.    Historical Provider, MD  oxyCODONE-acetaminophen (PERCOCET/ROXICET) 5-325 MG per tablet Take 1 tablet by mouth every 4 (four) hours as needed for severe pain. 10/05/13   Lyanne Co, MD   BP 117/86  Pulse 76  Temp(Src) 98.7 F (37.1 C) (Oral)  Resp 16  SpO2 97%  LMP 03/05/2014 Physical Exam  Nursing note and vitals reviewed. Constitutional: She is oriented to person, place, and time. She appears well-developed and well-nourished. No distress.  HENT:  Head: Normocephalic and atraumatic.  Right Ear: External ear normal.  Left Ear: External ear normal.  Mouth/Throat: Oropharynx is clear and moist. No oropharyngeal exudate.  Soft palate rise symmetrically.  Tongue midline with normal movement right and left and retraction. Swallowing intact.  Eyes: Conjunctivae and EOM are normal. Pupils are equal, round, and reactive to light. Right eye exhibits no discharge. Left eye exhibits no discharge.  Neck: Normal range of motion. Neck supple.  No cervical tenderness  Cardiovascular: Normal rate, regular rhythm, normal heart sounds and intact distal pulses.   Pulmonary/Chest: Effort normal and breath sounds normal. No respiratory distress.  Abdominal: Soft. There is no tenderness.  Musculoskeletal: Normal range of motion. She exhibits no edema and no tenderness.  Lymphadenopathy:    She has no cervical  adenopathy.  Neurological: She is alert and oriented to person, place, and time. She has normal strength. She displays no tremor. No cranial nerve deficit or sensory deficit. She exhibits normal muscle tone. Coordination and gait normal.  Skin: Skin is warm and dry. No rash noted.  Psychiatric: She  has a normal mood and affect.    ED Course  Procedures (including critical care time) Labs Review Labs Reviewed - No data to display  Imaging Review No results found.   MDM   1. Migraine without aura and without status migrainosus, not intractable     Post injections of Toradol, Solumedrol and po ZOfran pt is feeling much better with reduction in pain and photophobia. St ready to go. Note fotr work. Obtain a PCP for f/u    Hayden Rasmussenavid Jamen Loiseau, NP 03/05/14 1032

## 2014-03-05 NOTE — ED Notes (Signed)
Headache   With  Photophobia    Nausea  /  Vomiting  X  3  Days

## 2014-03-05 NOTE — Discharge Instructions (Signed)
Headaches, Frequently Asked Questions °MIGRAINE HEADACHES °Q: What is migraine? What causes it? How can I treat it? °A: Generally, migraine headaches begin as a dull ache. Then they develop into a constant, throbbing, and pulsating pain. You may experience pain at the temples. You may experience pain at the front or back of one or both sides of the head. The pain is usually accompanied by a combination of: °· Nausea. °· Vomiting. °· Sensitivity to light and noise. °Some people (about 15%) experience an aura (see below) before an attack. The cause of migraine is believed to be chemical reactions in the brain. Treatment for migraine may include over-the-counter or prescription medications. It may also include self-help techniques. These include relaxation training and biofeedback.  °Q: What is an aura? °A: About 15% of people with migraine get an "aura". This is a sign of neurological symptoms that occur before a migraine headache. You may see wavy or jagged lines, dots, or flashing lights. You might experience tunnel vision or blind spots in one or both eyes. The aura can include visual or auditory hallucinations (something imagined). It may include disruptions in smell (such as strange odors), taste or touch. Other symptoms include: °· Numbness. °· A "pins and needles" sensation. °· Difficulty in recalling or speaking the correct word. °These neurological events may last as long as 60 minutes. These symptoms will fade as the headache begins. °Q: What is a trigger? °A: Certain physical or environmental factors can lead to or "trigger" a migraine. These include: °· Foods. °· Hormonal changes. °· Weather. °· Stress. °It is important to remember that triggers are different for everyone. To help prevent migraine attacks, you need to figure out which triggers affect you. Keep a headache diary. This is a good way to track triggers. The diary will help you talk to your healthcare professional about your condition. °Q: Does  weather affect migraines? °A: Bright sunshine, hot, humid conditions, and drastic changes in barometric pressure may lead to, or "trigger," a migraine attack in some people. But studies have shown that weather does not act as a trigger for everyone with migraines. °Q: What is the link between migraine and hormones? °A: Hormones start and regulate many of your body's functions. Hormones keep your body in balance within a constantly changing environment. The levels of hormones in your body are unbalanced at times. Examples are during menstruation, pregnancy, or menopause. That can lead to a migraine attack. In fact, about three quarters of all women with migraine report that their attacks are related to the menstrual cycle.  °Q: Is there an increased risk of stroke for migraine sufferers? °A: The likelihood of a migraine attack causing a stroke is very remote. That is not to say that migraine sufferers cannot have a stroke associated with their migraines. In persons under age 40, the most common associated factor for stroke is migraine headache. But over the course of a person's normal life span, the occurrence of migraine headache may actually be associated with a reduced risk of dying from cerebrovascular disease due to stroke.  °Q: What are acute medications for migraine? °A: Acute medications are used to treat the pain of the headache after it has started. Examples over-the-counter medications, NSAIDs, ergots, and triptans.  °Q: What are the triptans? °A: Triptans are the newest class of abortive medications. They are specifically targeted to treat migraine. Triptans are vasoconstrictors. They moderate some chemical reactions in the brain. The triptans work on receptors in your brain. Triptans help   to restore the balance of a neurotransmitter called serotonin. Fluctuations in levels of serotonin are thought to be a main cause of migraine.  °Q: Are over-the-counter medications for migraine effective? °A:  Over-the-counter, or "OTC," medications may be effective in relieving mild to moderate pain and associated symptoms of migraine. But you should see your caregiver before beginning any treatment regimen for migraine.  °Q: What are preventive medications for migraine? °A: Preventive medications for migraine are sometimes referred to as "prophylactic" treatments. They are used to reduce the frequency, severity, and length of migraine attacks. Examples of preventive medications include antiepileptic medications, antidepressants, beta-blockers, calcium channel blockers, and NSAIDs (nonsteroidal anti-inflammatory drugs). °Q: Why are anticonvulsants used to treat migraine? °A: During the past few years, there has been an increased interest in antiepileptic drugs for the prevention of migraine. They are sometimes referred to as "anticonvulsants". Both epilepsy and migraine may be caused by similar reactions in the brain.  °Q: Why are antidepressants used to treat migraine? °A: Antidepressants are typically used to treat people with depression. They may reduce migraine frequency by regulating chemical levels, such as serotonin, in the brain.  °Q: What alternative therapies are used to treat migraine? °A: The term "alternative therapies" is often used to describe treatments considered outside the scope of conventional Western medicine. Examples of alternative therapy include acupuncture, acupressure, and yoga. Another common alternative treatment is herbal therapy. Some herbs are believed to relieve headache pain. Always discuss alternative therapies with your caregiver before proceeding. Some herbal products contain arsenic and other toxins. °TENSION HEADACHES °Q: What is a tension-type headache? What causes it? How can I treat it? °A: Tension-type headaches occur randomly. They are often the result of temporary stress, anxiety, fatigue, or anger. Symptoms include soreness in your temples, a tightening band-like sensation  around your head (a "vice-like" ache). Symptoms can also include a pulling feeling, pressure sensations, and contracting head and neck muscles. The headache begins in your forehead, temples, or the back of your head and neck. Treatment for tension-type headache may include over-the-counter or prescription medications. Treatment may also include self-help techniques such as relaxation training and biofeedback. °CLUSTER HEADACHES °Q: What is a cluster headache? What causes it? How can I treat it? °A: Cluster headache gets its name because the attacks come in groups. The pain arrives with little, if any, warning. It is usually on one side of the head. A tearing or bloodshot eye and a runny nose on the same side of the headache may also accompany the pain. Cluster headaches are believed to be caused by chemical reactions in the brain. They have been described as the most severe and intense of any headache type. Treatment for cluster headache includes prescription medication and oxygen. °SINUS HEADACHES °Q: What is a sinus headache? What causes it? How can I treat it? °A: When a cavity in the bones of the face and skull (a sinus) becomes inflamed, the inflammation will cause localized pain. This condition is usually the result of an allergic reaction, a tumor, or an infection. If your headache is caused by a sinus blockage, such as an infection, you will probably have a fever. An x-ray will confirm a sinus blockage. Your caregiver's treatment might include antibiotics for the infection, as well as antihistamines or decongestants.  °REBOUND HEADACHES °Q: What is a rebound headache? What causes it? How can I treat it? °A: A pattern of taking acute headache medications too often can lead to a condition known as "rebound headache."   A pattern of taking too much headache medication includes taking it more than 2 days per week or in excessive amounts. That means more than the label or a caregiver advises. With rebound  headaches, your medications not only stop relieving pain, they actually begin to cause headaches. Doctors treat rebound headache by tapering the medication that is being overused. Sometimes your caregiver will gradually substitute a different type of treatment or medication. Stopping may be a challenge. Regularly overusing a medication increases the potential for serious side effects. Consult a caregiver if you regularly use headache medications more than 2 days per week or more than the label advises. °ADDITIONAL QUESTIONS AND ANSWERS °Q: What is biofeedback? °A: Biofeedback is a self-help treatment. Biofeedback uses special equipment to monitor your body's involuntary physical responses. Biofeedback monitors: °· Breathing. °· Pulse. °· Heart rate. °· Temperature. °· Muscle tension. °· Brain activity. °Biofeedback helps you refine and perfect your relaxation exercises. You learn to control the physical responses that are related to stress. Once the technique has been mastered, you do not need the equipment any more. °Q: Are headaches hereditary? °A: Four out of five (80%) of people that suffer report a family history of migraine. Scientists are not sure if this is genetic or a family predisposition. Despite the uncertainty, a child has a 50% chance of having migraine if one parent suffers. The child has a 75% chance if both parents suffer.  °Q: Can children get headaches? °A: By the time they reach high school, most young people have experienced some type of headache. Many safe and effective approaches or medications can prevent a headache from occurring or stop it after it has begun.  °Q: What type of doctor should I see to diagnose and treat my headache? °A: Start with your primary caregiver. Discuss his or her experience and approach to headaches. Discuss methods of classification, diagnosis, and treatment. Your caregiver may decide to recommend you to a headache specialist, depending upon your symptoms or other  physical conditions. Having diabetes, allergies, etc., may require a more comprehensive and inclusive approach to your headache. The National Head[employer] Foundation will provide, upon request, a list of NHF physician members in your state. °Document Released: 10/14/2003 Document Revised: 10/16/2011 Document Reviewed: 03/23/2008 °ExitCare® Patient Information ©2015 [employer]. This information is not intended to replace advice given to you by your health care provider. Make sure you discuss any questions you have with your health care provider. ° °Recurrent Migraine Headache °A migraine headache is an intense, throbbing pain on one or both sides of your head. Recurrent migraines keep coming back. A migraine can last for 30 minutes to several hours. °CAUSES  °The exact cause of a migraine headache is not always known. However, a migraine may be caused when nerves in the brain become irritated and release chemicals that cause inflammation. This causes pain. °Certain things may also trigger migraines, such as:  °· Alcohol. °· Smoking. °· Stress. °· Menstruation. °· Aged cheeses. °· Foods or drinks that contain nitrates, glutamate, aspartame, or tyramine. °· Lack of sleep. °· Chocolate. °· Caffeine. °· Hunger. °· Physical exertion. °· Fatigue. °· Medicines used to treat chest pain (nitroglycerine), birth control pills, estrogen, and some blood pressure medicines. °SYMPTOMS  °· Pain on one or both sides of your head. °· Pulsating or throbbing pain. °· Severe pain that prevents daily activities. °· Pain that is aggravated by any physical activity. °· Nausea, vomiting, or both. °· Dizziness. °· Pain with exposure to bright   lights, loud noises, or activity. °· General sensitivity to bright lights, loud noises, or smells. °Before you get a migraine, you may get warning signs that a migraine is coming (aura). An aura may include: °· Seeing flashing lights. °· Seeing bright spots, halos, or zigzag lines. °· Having tunnel vision  or blurred vision. °· Having feelings of numbness or tingling. °· Having trouble talking. °· Having muscle weakness. °DIAGNOSIS  °A recurrent migraine headache is often diagnosed based on: °· Symptoms. °· Physical examination. °· A CT scan or MRI of your head. These imaging tests cannot diagnose migraines but can help rule out other causes of headaches.   °TREATMENT  °Medicines may be given for pain and nausea. Medicines can also be given to help prevent recurrent migraines. °HOME CARE INSTRUCTIONS °· Only take over-the-counter or prescription medicines for pain or discomfort as directed by your health care provider. The use of long-term narcotics is not recommended. °· Lie down in a dark, quiet room when you have a migraine. °· Keep a journal to find out what may trigger your migraine headaches. For example, write down: °¨ What you eat and drink. °¨ How much sleep you get. °¨ Any change to your diet or medicines. °· Limit alcohol consumption. °· Quit smoking if you smoke. °· Get 7-9 hours of sleep, or as recommended by your health care provider. °· Limit stress. °· Keep lights dim if bright lights bother you and make your migraines worse. °SEEK MEDICAL CARE IF:  °· You do not get relief from the medicines given to you. °· You have a recurrence of pain. °· You have a fever. °SEEK IMMEDIATE MEDICAL CARE IF: °· Your migraine becomes severe. °· You have a stiff neck. °· You have loss of vision. °· You have muscular weakness or loss of muscle control. °· You start losing your balance or have trouble walking. °· You feel faint or pass out. °· You have severe symptoms that are different from your first symptoms. °MAKE SURE YOU:  °· Understand these instructions. °· Will watch your condition. °· Will get help right away if you are not doing well or get worse. °Document Released: 04/18/2001 Document Revised: 12/08/2013 Document Reviewed: 03/31/2013 °ExitCare® Patient Information ©2015 [employer]. This information is not  intended to replace advice given to you by your health care provider. Make sure you discuss any questions you have with your health care provider. ° °

## 2014-03-06 NOTE — ED Provider Notes (Signed)
Medical screening examination/treatment/procedure(s) were performed by a resident physician or non-physician practitioner and as the supervising physician I was immediately available for consultation/collaboration.  Evan Corey, MD   Evan S Corey, MD 03/06/14 0726 

## 2014-10-26 ENCOUNTER — Ambulatory Visit (INDEPENDENT_AMBULATORY_CARE_PROVIDER_SITE_OTHER): Payer: 59 | Admitting: Certified Nurse Midwife

## 2014-10-26 ENCOUNTER — Encounter: Payer: Self-pay | Admitting: Certified Nurse Midwife

## 2014-10-26 ENCOUNTER — Other Ambulatory Visit: Payer: Self-pay | Admitting: Certified Nurse Midwife

## 2014-10-26 VITALS — BP 122/90 | HR 90 | Ht 68.0 in | Wt 258.0 lb

## 2014-10-26 DIAGNOSIS — Z124 Encounter for screening for malignant neoplasm of cervix: Secondary | ICD-10-CM

## 2014-10-26 DIAGNOSIS — N63 Unspecified lump in unspecified breast: Secondary | ICD-10-CM

## 2014-10-26 DIAGNOSIS — Z01419 Encounter for gynecological examination (general) (routine) without abnormal findings: Secondary | ICD-10-CM | POA: Diagnosis not present

## 2014-10-26 DIAGNOSIS — N6310 Unspecified lump in the right breast, unspecified quadrant: Secondary | ICD-10-CM

## 2014-10-26 DIAGNOSIS — E041 Nontoxic single thyroid nodule: Secondary | ICD-10-CM

## 2014-10-26 DIAGNOSIS — Z0000 Encounter for general adult medical examination without abnormal findings: Secondary | ICD-10-CM | POA: Diagnosis not present

## 2014-10-26 LAB — POCT URINALYSIS DIPSTICK
Leukocytes, UA: NEGATIVE
UROBILINOGEN UA: NEGATIVE
pH, UA: 5

## 2014-10-26 LAB — HEMOGLOBIN, FINGERSTICK: Hemoglobin, fingerstick: 13.8 g/dL (ref 12.0–16.0)

## 2014-10-26 NOTE — Progress Notes (Signed)
Scheduled patient in office for bilateral diagnostic mammogram with right breast ultrasound at [facility]. Appointment scheduled for 3/28 at 2pm. Patient is agreeable to date and time. Spoke with Toni Amendourtney at [facility]. Records from thyroid ultrasound requested. Toni AmendCourtney will fax results from 2014 at this time for Melissa Hubbard CNM to review. Patient signed release of information for request of these records.

## 2014-10-26 NOTE — Progress Notes (Signed)
44 y.o. [id number] Single  African American Fe here to establish gyn care and for annual exam. Periods normal, no issues. Contraception BTL. Desires STD screening. Denies urinary symptoms, feels like she will start  Menses soon. She has not noted in blood in her urine. History of migraine headache with aura, usually once monthly.  History of abnormal pap smear at age 44, no treatment, normal pap smears since then. Sees PCP prn and for labs.. Was seen for ? Enlarged thyroid with US and never received results. No excessive fatigue or skin or hair change.. No other health issues today. Mammogram due. Total 15 tattoos would like Hep. C screening also.   Patient's last menstrual period was 09/26/2014.          Sexually active: Yes.    The current method of family planning is tubal ligation.    Exercising: No.  exercise Smoker:  yes  Health Maintenance: Pap:  2014 neg per patient Abnormal age 44 per patient with repeat pap only MMG:  2013 Colonoscopy:  none BMD:   none TDaP: 2014 Labs: Hgb-13.8, Poct urine-rbc 2+ menses onset Self breast exam: done monthly   reports that she has been smoking Cigarettes.  She has been smoking about 0.50 packs per day. She has never used smokeless tobacco. She reports that she drinks about 1.8 oz of alcohol per week. She reports that she uses illicit drugs (Marijuana).  Past Medical History  Diagnosis Date  . Abnormal Pap smear of cervix     age 44  . Migraines   . Blood transfusion without reported diagnosis 1981    blood recieved from mother when tonsils removed  . Dysmenorrhea   . Thyroid disease   . Urinary incontinence     Past Surgical History  Procedure Laterality Date  . Tonsillectomy    . Tubal ligation    . Dilation and curettage of uterus      from miscarriage  . Colposcopy      age 44    Current Outpatient Prescriptions  Medication Sig Dispense Refill  . Multiple Vitamin (MULITIVITAMIN WITH MINERALS) TABS Take 1 tablet by mouth daily.      No current facility-administered medications for this visit.    Family History  Problem Relation Age of Onset  . Hypertension Mother   . Breast cancer Maternal Grandmother   . Lung cancer Maternal Grandmother     ROS:  Pertinent items are noted in HPI.  Otherwise, a comprehensive ROS was negative.  Exam:   BP 130/86 mmHg  Pulse 90  Ht 5\' 8"  (1.727 m)  Wt 258 lb (117.028 kg)  BMI 39.24 kg/m2  LMP 09/26/2014 Height: 5\' 8"  (172.7 cm) Ht Readings from Last 3 Encounters:  10/26/14 5\' 8"  (1.727 m)    General appearance: alert, cooperative and appears stated age Head: Normocephalic, without obvious abnormality, atraumatic Neck: no adenopathy, supple, symmetrical, trachea midline and thyroid normal to inspection and palpation and ? Nodule noted on right Lungs: clear to auscultation bilaterally Negative CVAT bilateral Breasts: normal appearance, no masses or tenderness, No nipple retraction or dimpling, No nipple discharge or bleeding, No axillary or supraclavicular adenopathy, positive findings: right breast mass at 12 o'clock at aerola edge, non tender, no discharge. Heart: regular rate and rhythm Abdomen: soft, non-tender; no masses,  no organomegaly, no suprapubic tenderness Extremities: extremities normal, atraumatic, no cyanosis or edema Skin: Skin color, texture, turgor normal. No rashes or lesions Lymph nodes: Cervical, supraclavicular, and axillary nodes normal. No abnormal  inguinal nodes palpated Neurologic: Grossly normal   Pelvic: External genitalia:  no lesions              Urethra:  normal appearing urethra with no masses, tenderness or lesions  Bladder, urethral meatus non tender              Bartholin's and Skene's: normal                 Vagina: normal appearing vagina with normal color and discharge, no lesions              Cervix: normal, no lesions, non tender, blood noted from cervix              Pap taken: Yes.   Bimanual Exam:  Uterus:  normal size,  contour, position, consistency, mobility, non-tender              Adnexa: normal adnexa and no mass, fullness, tenderness               Rectovaginal: Confirms               Anus:  normal sphincter tone, no lesions  Chaperone present: Yes  A:  Well Woman with normal exam  Contraception  BTL  STD screening   Thyroid nodule on right  Right Breast Mass  ?elevated BP     P:   Reviewed health and wellness pertinent to exam  Labs: HIV,RPR,Hep.C, GC, Chlamydia  Request records from Thyroid US for follow up  Lab TSH with panel  Discussed finding and need for evaluation with diagnostic mammogram and [place], patient agreeable and wil schedule today  Discussed finding and patient has been followed with PCP regarding. Encouraged to monitor and advise if elevations. Reduce salt in diet.  Pap smear taken today with HPVHR    counseled on breast self exam, mammography screening, STD prevention, HIV risk factors and prevention, Discussed diet, exercise and calcium and Vitamin D importance.  return annually or prn  An After Visit Summary was printed and given to the patient.

## 2014-10-26 NOTE — Patient Instructions (Signed)

## 2014-10-27 LAB — THYROID PANEL WITH TSH
FREE THYROXINE INDEX: 2.7 (ref 1.4–3.8)
T3 Uptake: 31 % (ref 22–35)
T4, Total: 8.6 ug/dL (ref 4.5–12.0)
TSH: 0.459 u[IU]/mL (ref 0.350–4.500)

## 2014-10-27 LAB — HIV ANTIBODY (ROUTINE TESTING W REFLEX): HIV: NONREACTIVE

## 2014-10-27 LAB — HEPATITIS C ANTIBODY: HCV Ab: NEGATIVE

## 2014-10-27 LAB — RPR

## 2014-10-28 LAB — IPS N GONORRHOEA AND CHLAMYDIA BY PCR

## 2014-10-28 LAB — IPS PAP TEST WITH HPV

## 2014-10-30 ENCOUNTER — Other Ambulatory Visit: Payer: Self-pay

## 2014-10-30 ENCOUNTER — Other Ambulatory Visit: Payer: Self-pay | Admitting: Certified Nurse Midwife

## 2014-10-30 DIAGNOSIS — N63 Unspecified lump in unspecified breast: Secondary | ICD-10-CM

## 2014-11-02 ENCOUNTER — Ambulatory Visit
Admission: RE | Admit: 2014-11-02 | Discharge: 2014-11-02 | Disposition: A | Discharge status: Home / Self Care | Payer: 59 | Source: Ambulatory Visit | Attending: Certified Nurse Midwife | Admitting: Certified Nurse Midwife

## 2014-11-02 DIAGNOSIS — N63 Unspecified lump in unspecified breast: Secondary | ICD-10-CM

## 2014-11-03 ENCOUNTER — Other Ambulatory Visit: Payer: Self-pay | Admitting: Certified Nurse Midwife

## 2014-11-03 DIAGNOSIS — R87611 Atypical squamous cells cannot exclude high grade squamous intraepithelial lesion on cytologic smear of cervix (ASC-H): Secondary | ICD-10-CM

## 2014-11-04 NOTE — Progress Notes (Signed)
Reviewed personally.  M. Suzanne Chetara Kropp, MD.  

## 2014-11-05 ENCOUNTER — Telehealth: Payer: Self-pay | Admitting: Emergency Medicine

## 2014-11-05 NOTE — Telephone Encounter (Signed)
-----   Message from Verner Choleborah S Leonard, CNM sent at 11/03/2014  5:19 PM EDT ----- Reviewed mammogram and US area of concern showed a complicated cyst, no mammographic evidence of malignancy. Needs repeat mammogram in one year. Please notify patient.  Out of hold?

## 2014-11-05 NOTE — Telephone Encounter (Signed)
Patient notified of messages from Verner Choleborah S. Leonard CNM.  She is agreeable to scheduling colposcopy. Advised would be scheduled with Dr. Hyacinth MeekerMiller, patient agreeable.  Brief description of procedure given to patient. Patient had colposcopy at age 44 and states she remembers procedure well. Colposcopy pre-procedure instructions given. Discussed menses and need to not have any bleeding on day of appointment, advised to call to reschedule if starts cycle. Patient with hx of BTL, completed cycle 3 days ago per patient.  Make sure to eat a meal and hydrate before appointment.  Advised 800 mg of Motrin with food one hour prior to appointment. Motrin/Advil or Ibuprofen. Take 800 mg (Can purchase over the counter, you will need four 200 mg pills).  Advised will need to cancel or reschedule within 72 hours or will have $150.00 no show fee placed to account.   Patient verbalized understanding of preprocedure instructions and cancellation policy and will call to reschedule if will be on menses or has any concerns regarding pregnancy.   Patient advised would be contacted by insurance coordinator to discuss insurance coverage of procedure.   Routing to provider for final review. Patient agreeable to disposition. Will close encounter  cc Verner Choleborah S. Leonard CNM, Cathrine MusterSabrina Franklin.

## 2014-11-05 NOTE — Telephone Encounter (Signed)
Message left to return call to [facility] at [phone].   Patient needs two messages from Verner Choleborah S. Leonard CNM. Patient with hx of BTL. Needs Colposcopy scheduled with Dr. Hyacinth MeekerMiller. ASCUS-H pap smear.

## 2014-11-05 NOTE — Telephone Encounter (Signed)
Spoke with patient. Advised important to keep appointment as scheduled and offered to route patient back to insurance department to discuss financial concerns and set up financial arrangements. Patient declines this at this time. She states she needs to call back after speaking with her company [employer]SA account and that she will call back this afternoon after 2:30 to discuss again when she is off of work.   Patient also has message from Verner Choleborah S. Leonard CNM regarding thyroid ultrasound and thyroid enlargement. Last Thyroid ultrasound completed at [facility] on 11/05/2013 that was ordered by Anna GenreStephanie Powers, MD. Advised follow up in 3-6 months. Patient has not completed follow up and still has thyroid enlargement. Will need to schedule follow up ultrasound per Verner Choleborah S. Leonard CNM.

## 2014-11-05 NOTE — Telephone Encounter (Signed)
-----   Message from Verner Choleborah S Leonard, CNM sent at 11/03/2014  8:14 AM EDT ----- Notify patient that her pap smear is abnormal with ASCUS -H with HPVHR not detected and colposcopy evaluation needed. Order in please schedule

## 2014-11-05 NOTE — Telephone Encounter (Signed)
Left message for patient to call back. Need to go over benefits for colpo. °

## 2014-11-05 NOTE — Telephone Encounter (Signed)
Call to patient. Advised of benefit quote received for colpo.  Patient would like to reschedule. [facility].

## 2014-11-06 NOTE — Telephone Encounter (Signed)
Patient returned call. She cannot keep appointment for 11/09/14 for colposcopy with Dr. Hyacinth MeekerMiller and declines to set up financial arrangements. She requests specific date of 12/04/14 to schedule procedure. Rescheduled patient for 12/04/14 with Dr. Edward JollySilva. Pap smear ASC-H with negative HR HPV.   Patient with BTL for contraception. Verbalizes understanding of need to keep appointment for evaluation of cervix for colposcopy as it is a diagnostic test for abnormal cells on her cervix up to and including cancer cells. Patient also aware of need to not be on cycle for procedure, she will call if any changes to cycle. Also, is given pre procedure instructions again and verbalizes understanding.   Patient given message from Verner Choleborah S. Leonard CNM regarding need for follow up ultrasound of thyroid. Patient wishes to wait to schedule follow up ultrasound after colposcopy.   Routing to provider for final review. Patient agreeable to disposition. Will close encounter.   Routing to Dr. Edward JollySilva and Verner Choleborah S. Leonard CNM.

## 2014-11-09 ENCOUNTER — Ambulatory Visit: Payer: 59 | Admitting: Obstetrics & Gynecology

## 2014-11-09 NOTE — Telephone Encounter (Signed)
We need to make sure she has a Thyroid US there was indication on there are findings concerns see report. ? Recall for appointment

## 2014-11-10 ENCOUNTER — Other Ambulatory Visit: Payer: Self-pay

## 2014-11-10 DIAGNOSIS — R87611 Atypical squamous cells cannot exclude high grade squamous intraepithelial lesion on cytologic smear of cervix (ASC-H): Secondary | ICD-10-CM

## 2014-11-18 ENCOUNTER — Telehealth: Payer: Self-pay | Admitting: Certified Nurse Midwife

## 2014-11-18 NOTE — Telephone Encounter (Signed)
Left message regarding upcoming appointment has been canceled and needs to be rescheduled. °

## 2014-11-24 ENCOUNTER — Telehealth: Payer: Self-pay | Admitting: Nurse Practitioner

## 2014-11-24 NOTE — Telephone Encounter (Signed)
erro  neous encounter

## 2014-11-30 ENCOUNTER — Telehealth: Payer: Self-pay | Admitting: Obstetrics and Gynecology

## 2014-11-30 NOTE — Telephone Encounter (Signed)
Dr. Hyacinth MeekerMiller,   Please review and advise.   Patient with Pap smear from 10/26/14- Atypical squamous cells of undetermined significance, a high-grade squamous intraepithelial lesion cannot be excluded (ASC-H).  Patient was contacted and scheduled for 11/09/14 and cancelled and then rescheduled for 12/04/14 and cancelled.   Also, patient in imaging hold for thyroid ultrasound and thyroid enlargement. Last Thyroid ultrasound completed at [facility] on 11/05/2013 that was ordered by Anna GenreStephanie Powers, MD. Advised follow up in 3-6 months. Patient has not completed follow up and still has thyroid enlargement at recent annual exam with Verner Choleborah S. Leonard CNM. Patient declined to schedule thyroid ultrasound until after colposcopy was completed.

## 2014-11-30 NOTE — Telephone Encounter (Signed)
Pt called to cancel procedure appointment on 12/04/14 for colpo. Pt states is unable to reschedule at this time and will call when ready. Appt cancelled.

## 2014-12-01 NOTE — Telephone Encounter (Signed)
Kennon RoundsSally, Can you write 30 day letter and I will sign?  THanks.  CC:  Almedia Ballsracy Fast

## 2014-12-04 ENCOUNTER — Ambulatory Visit: Payer: 59 | Admitting: Obstetrics and Gynecology

## 2014-12-07 ENCOUNTER — Encounter: Payer: Self-pay | Admitting: *Deleted

## 2014-12-07 NOTE — Telephone Encounter (Signed)
Letter to your desk for review.  

## 2014-12-09 NOTE — Telephone Encounter (Signed)
30 day letter mailed on 12-08-14.  Routing to provider for final review. Will close encounter.

## 2015-05-31 ENCOUNTER — Other Ambulatory Visit: Payer: Self-pay | Admitting: Obstetrics

## 2015-05-31 ENCOUNTER — Ambulatory Visit (INDEPENDENT_AMBULATORY_CARE_PROVIDER_SITE_OTHER): Payer: 59 | Admitting: Obstetrics

## 2015-05-31 VITALS — BP 121/83 | HR 85 | Temp 98.2°F | Wt 258.0 lb

## 2015-05-31 DIAGNOSIS — N63 Unspecified lump in unspecified breast: Secondary | ICD-10-CM

## 2015-06-01 ENCOUNTER — Encounter: Payer: Self-pay | Admitting: Obstetrics

## 2015-06-01 NOTE — Progress Notes (Signed)
Patient ID: Melissa Hubbard, female   DOB: 05/11/1971, 44 y.o.   MRN: [id number]  Chief Complaint  Patient presents with  . Breast Problem    possible cyst    HPI Melissa Hubbard is a 44 y.o. female.  Breast cyst.  HPI  Past Medical History  Diagnosis Date  . Abnormal Pap smear of cervix     age 44  . Migraines   . Blood transfusion without reported diagnosis 1981    blood recieved from mother when tonsils removed  . Dysmenorrhea   . Thyroid disease   . Urinary incontinence     Past Surgical History  Procedure Laterality Date  . Tonsillectomy    . Tubal ligation    . Dilation and curettage of uterus      from miscarriage  . Colposcopy      age 44  . Wisdom tooth extraction Bilateral 08/07/1998    Family History  Problem Relation Age of Onset  . Hypertension Mother   . Breast cancer Maternal Grandmother   . Lung cancer Maternal Grandmother     Social History Social History  Substance Use Topics  . Smoking status: Current Every Day Smoker -- 0.50 packs/day    Types: Cigarettes  . Smokeless tobacco: Never Used  . Alcohol Use: 1.8 oz/week    3 Standard drinks or equivalent per week    Allergies  Allergen Reactions  . Penicillins Hives    Current Outpatient Prescriptions  Medication Sig Dispense Refill  . Multiple Vitamin (MULITIVITAMIN WITH MINERALS) TABS Take 1 tablet by mouth daily.     No current facility-administered medications for this visit.    Review of Systems Review of Systems Constitutional: negative for fatigue and weight loss Respiratory: negative for cough and wheezing Cardiovascular: negative for chest pain, fatigue and palpitations Gastrointestinal: negative for abdominal pain and change in bowel habits Genitourinary:negative Integument/breast: negative for nipple discharge Musculoskeletal:negative for myalgias Neurological: negative for gait problems and tremors Behavioral/Psych: negative for abusive relationship,  depression Endocrine: negative for temperature intolerance     Blood pressure 121/83, pulse 85, temperature 98.2 F (36.8 C), weight 258 lb (117.028 kg), last menstrual period 05/28/2015.  Physical Exam Physical Exam General:   alert  Skin:   no rash or abnormalities  Lungs:   clear to auscultation bilaterally  Heart:   regular rate and rhythm, S1, S2 normal, no murmur, click, rub or gallop  Breasts:   3-4 cm mass, periareolar area of left breast, nontender, soft, mobile     Data Reviewed Previous mammogram  Assessment     Breast mass, left breast areolar area    Plan    Referred to [facility] Center F/U prn   Orders Placed This Encounter  Procedures  . MM DIAG BREAST TOMO UNI LEFT    05-31-15/Order requested/aw  Left breast mass @ 6 o'clock/No hx of br ca/No implants or reductions  Pf 11-02-14 bcg/no needs/Ins [facility]/aw & Barb w/epic order     Standing Status: Future     Number of Occurrences:      Standing Expiration Date: 07/30/2016    Order Specific Question:  Reason for Exam (SYMPTOM  OR DIAGNOSIS REQUIRED)    Answer:  Left breast mass @ 6 o'clock     Order Specific Question:  Is the patient pregnant?    Answer:  No    Order Specific Question:  [facility]?    [facility]   No orders of the defined  types were placed in this encounter.

## 2015-06-07 ENCOUNTER — Ambulatory Visit
Admission: RE | Admit: 2015-06-07 | Discharge: 2015-06-07 | Disposition: A | Discharge status: Home / Self Care | Payer: 59 | Source: Ambulatory Visit | Attending: Obstetrics | Admitting: Obstetrics

## 2015-06-07 ENCOUNTER — Other Ambulatory Visit: Payer: Self-pay | Admitting: Obstetrics

## 2015-06-07 DIAGNOSIS — N63 Unspecified lump in unspecified breast: Secondary | ICD-10-CM

## 2015-06-14 ENCOUNTER — Ambulatory Visit
Admission: RE | Admit: 2015-06-14 | Discharge: 2015-06-14 | Disposition: A | Discharge status: Home / Self Care | Payer: 59 | Source: Ambulatory Visit | Attending: Obstetrics | Admitting: Obstetrics

## 2015-06-14 DIAGNOSIS — N63 Unspecified lump in unspecified breast: Secondary | ICD-10-CM

## 2015-10-11 IMAGING — CR DG SCAPULA*L*
2 series · 2 of 2 positions shown · non-contrast
Comparison: None.

CLINICAL DATA: Fall, left shoulder pain

EXAM:
LEFT SCAPULA - 2+ VIEWS

[t scapula ap left]
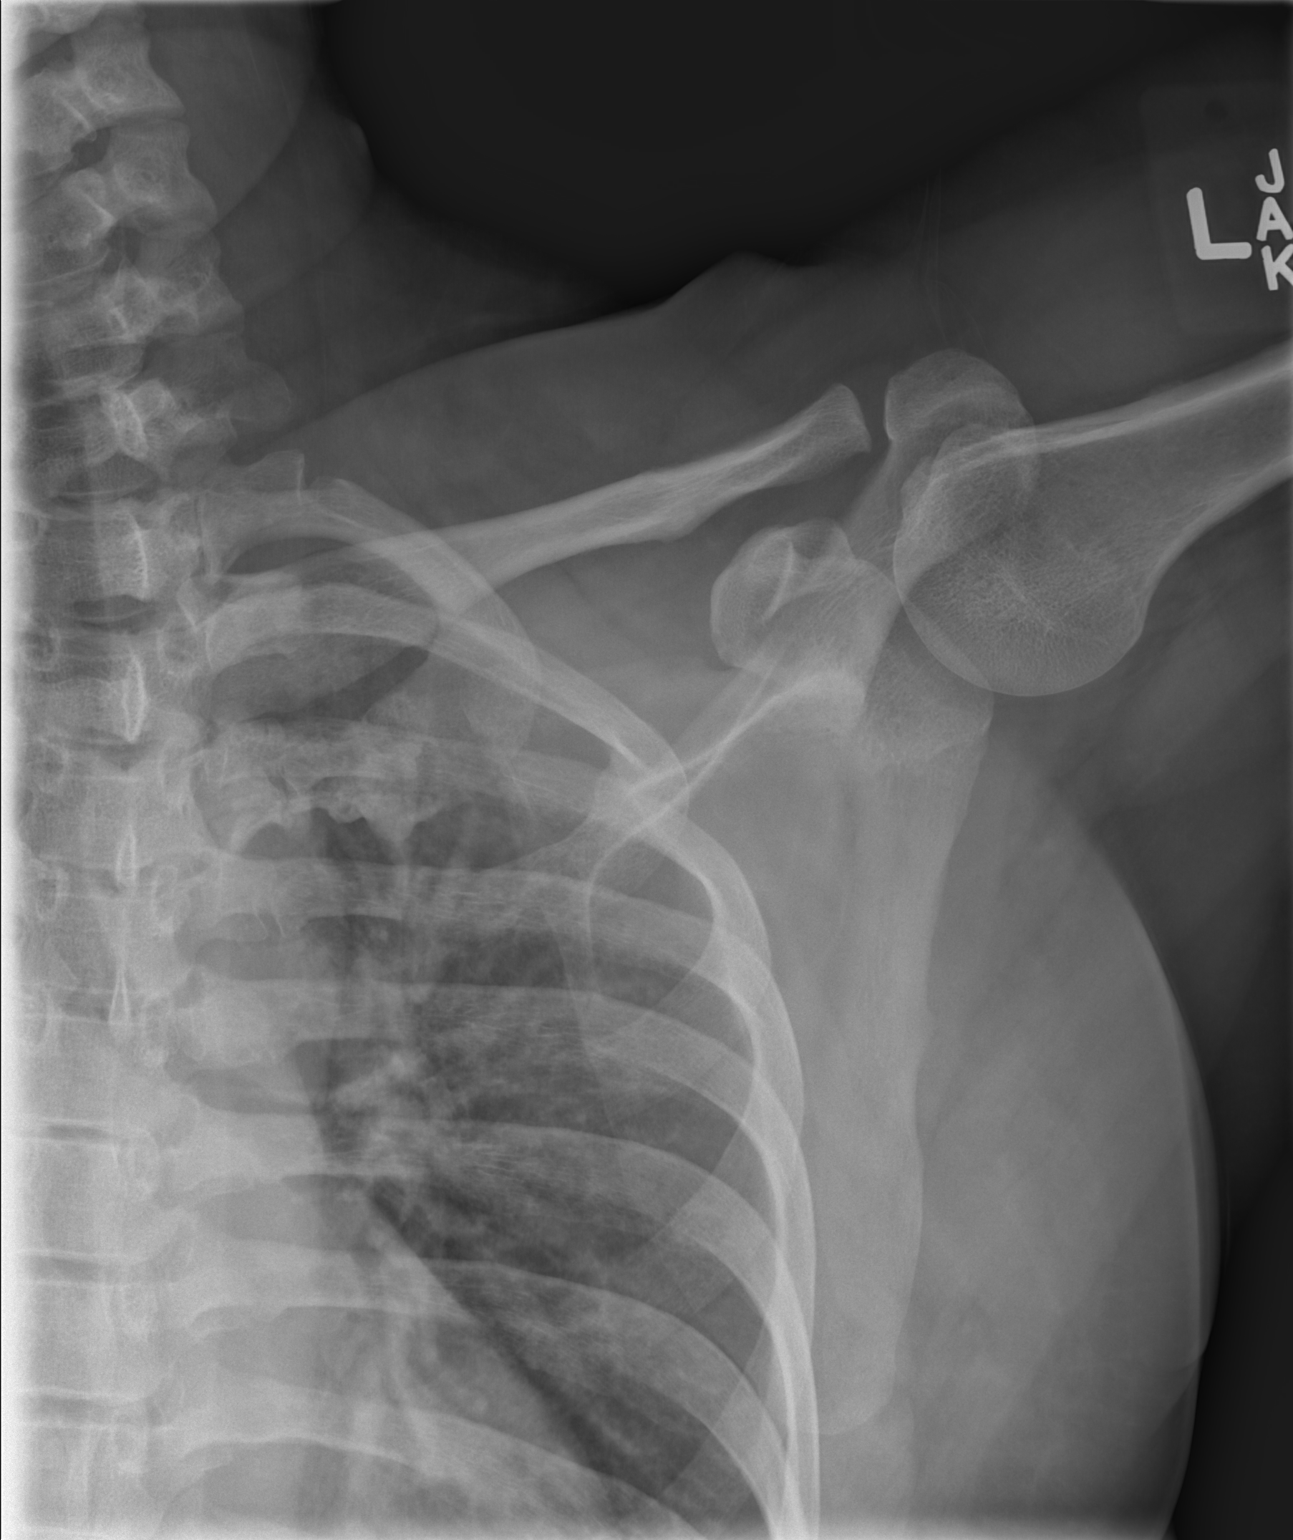

[t scapula y-view left]
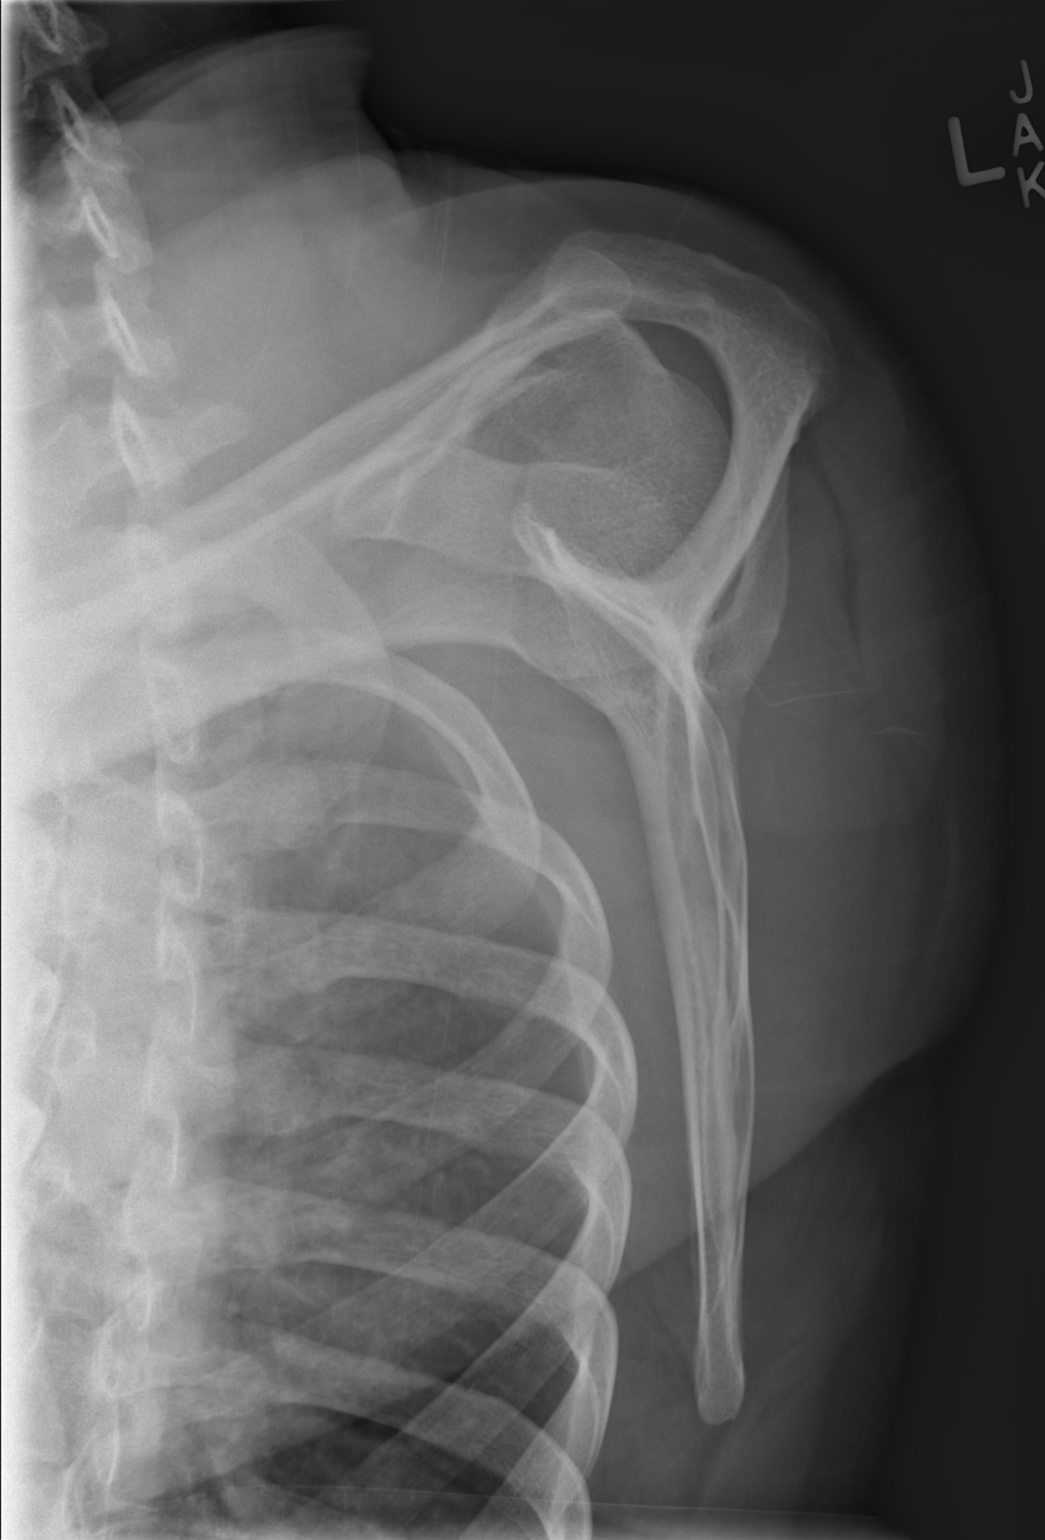

[2 of 2 positions shown; findings below may reference images not displayed]

FINDINGS: There is no evidence of fracture or other focal bone lesions. Soft
tissues are unremarkable.
IMPRESSION: Negative.

## 2015-10-11 IMAGING — CR DG SACRUM/COCCYX 2+V
3 series · 3 of 3 positions shown · non-contrast
Comparison: DG LUMBAR SPINE COMPLETE dated 10/05/2013

CLINICAL DATA: Post fall, now with sacral pain

EXAM:
SACRUM AND COCCYX - 2+ VIEW

[t sacrum ap]
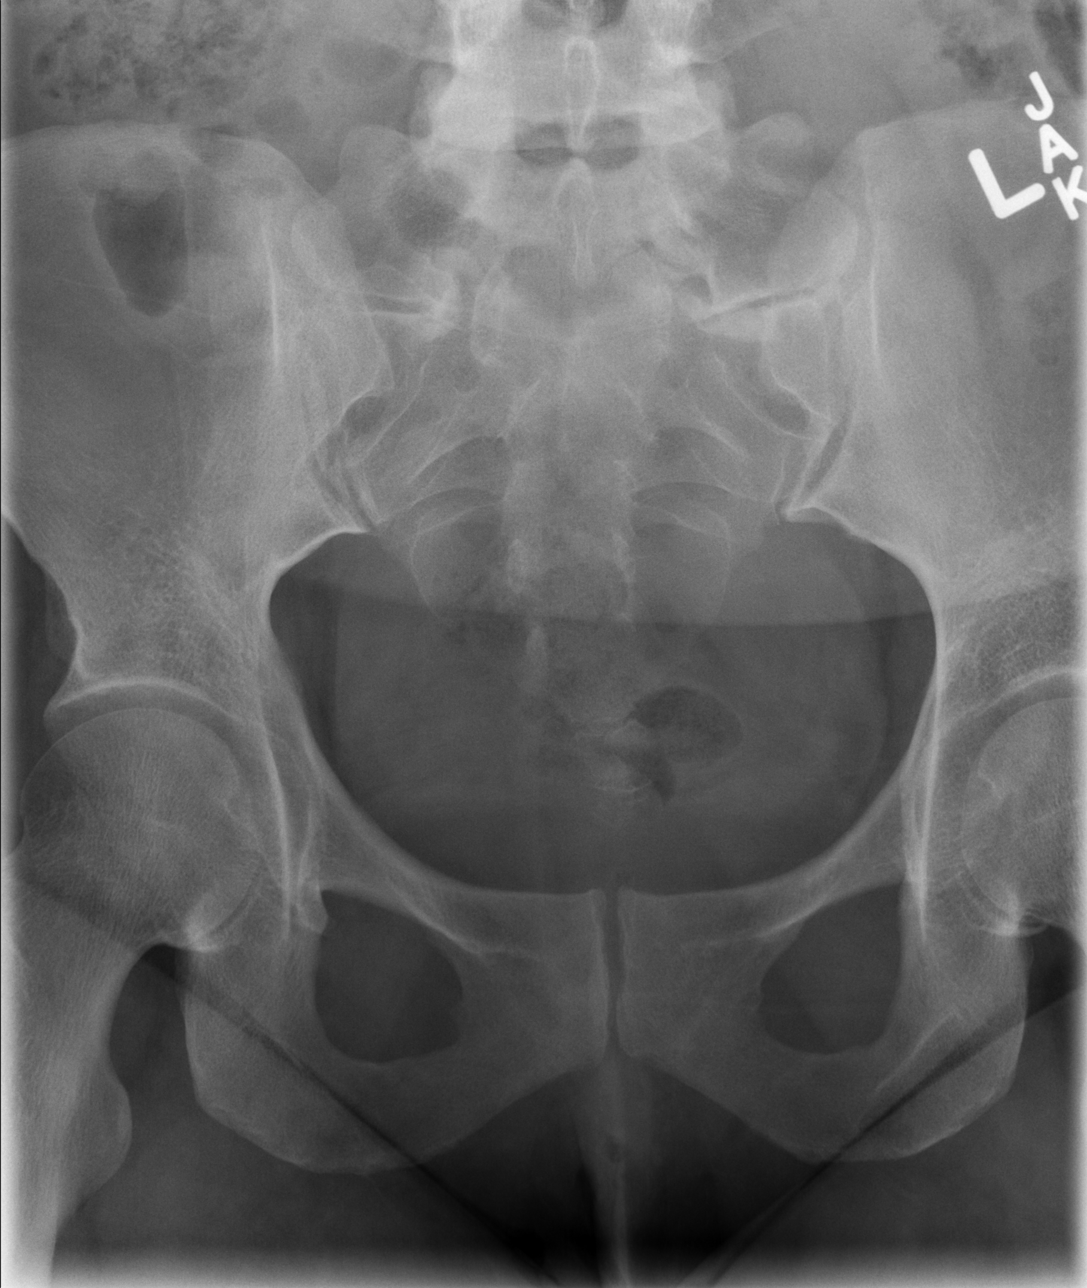

[t coccyx ap]
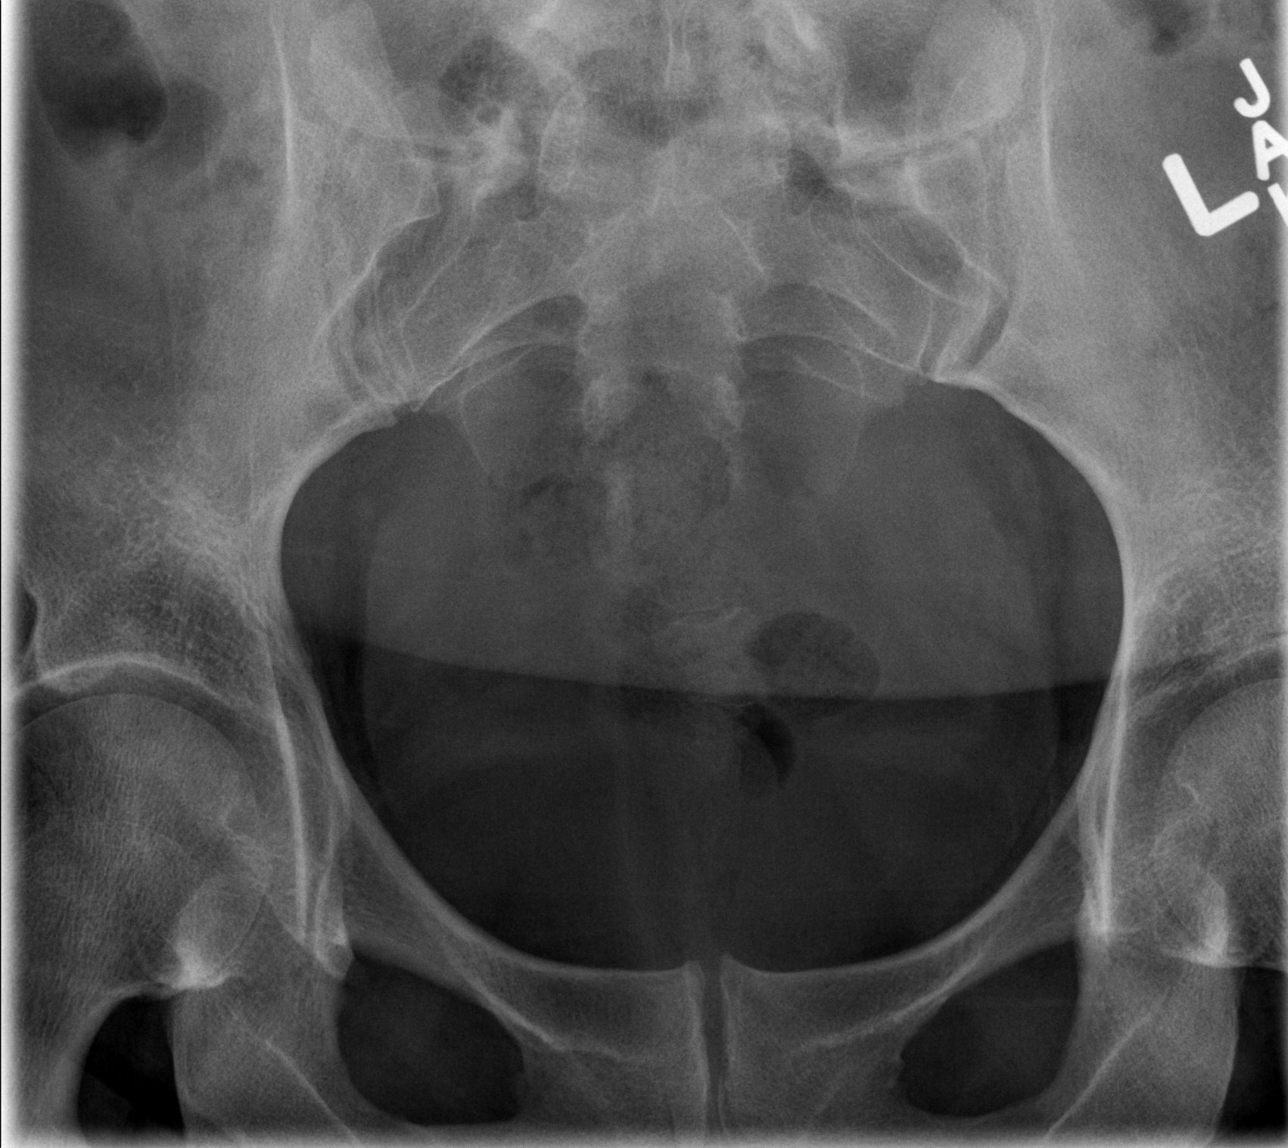

[t sacrum coccyx lat]
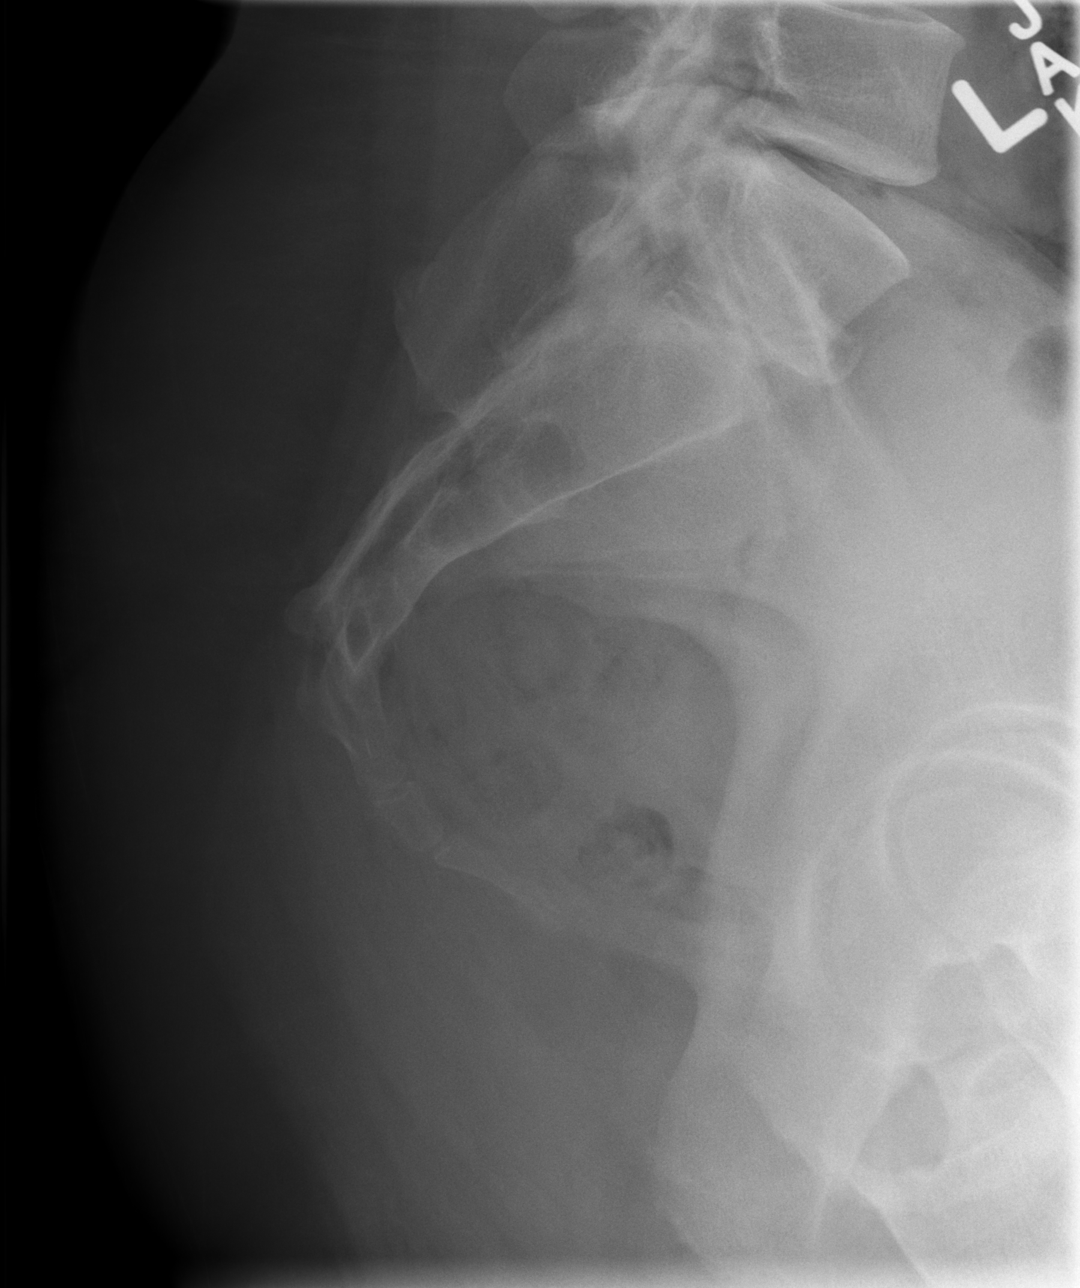

[3 of 3 positions shown; findings below may reference images not displayed]

FINDINGS: No displaced sacral or coccygeal fracture.

Normal appearance of the bilateral SI joints and the pubic
symphysis.

Limited visualization the bilateral hips is normal.

Regional soft tissues appear normal.  No radiopaque foreign body.
IMPRESSION: No displaced sacral or coccygeal fracture.

## 2015-10-11 IMAGING — CR DG LUMBAR SPINE COMPLETE 4+V
5 series · 5 of 5 positions shown · non-contrast
Comparison: DG SACRUM/COCCYX dated 10/05/2013

CLINICAL DATA: Post fall, now with low back pain

EXAM:
LUMBAR SPINE - COMPLETE 4+ VIEW

[t lumbar spine ap]
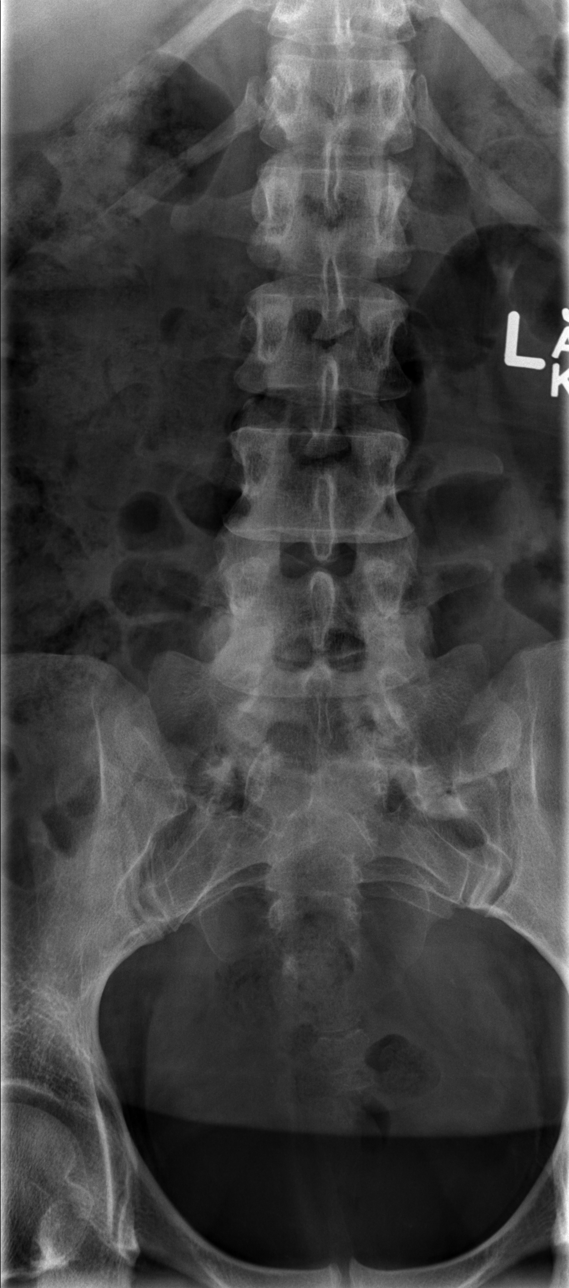

[t lumbar spine obl (1 of 2)]
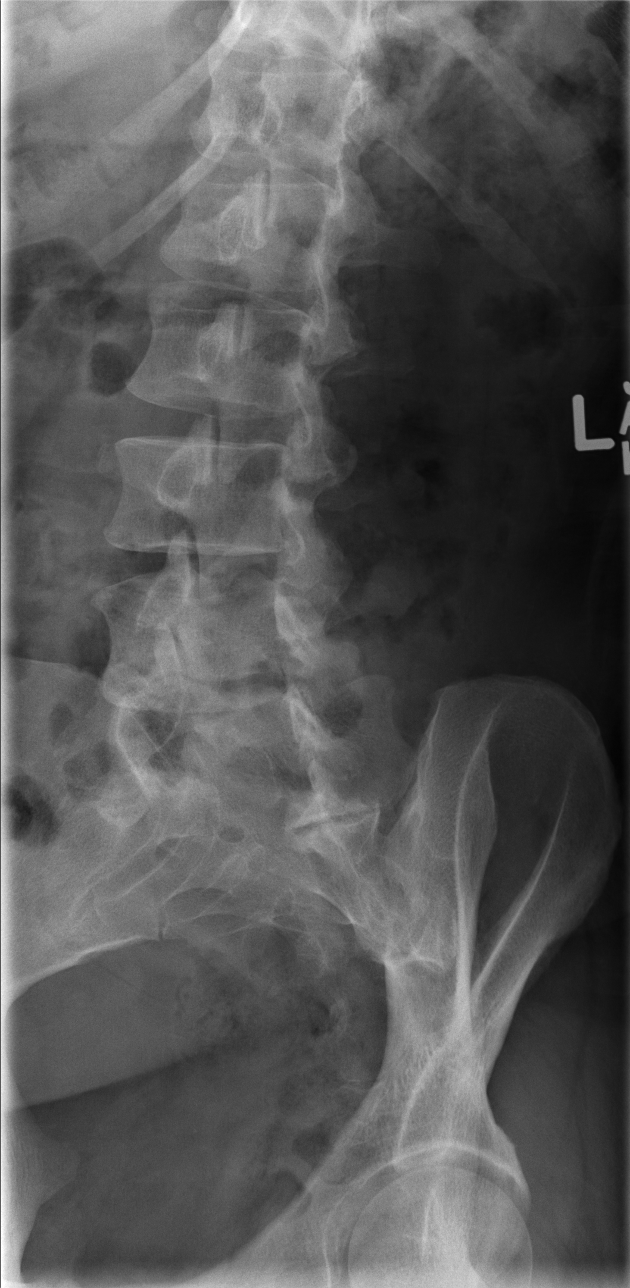

[t lumbar spine obl (2 of 2)]
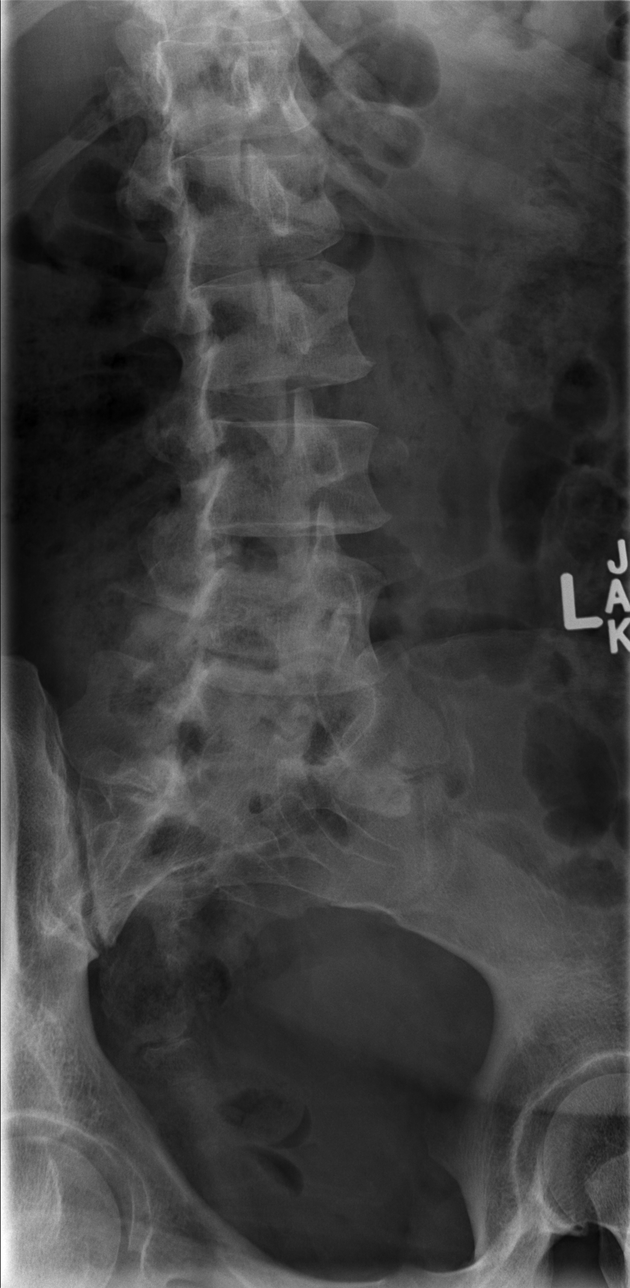

[t lumbar spine lat]
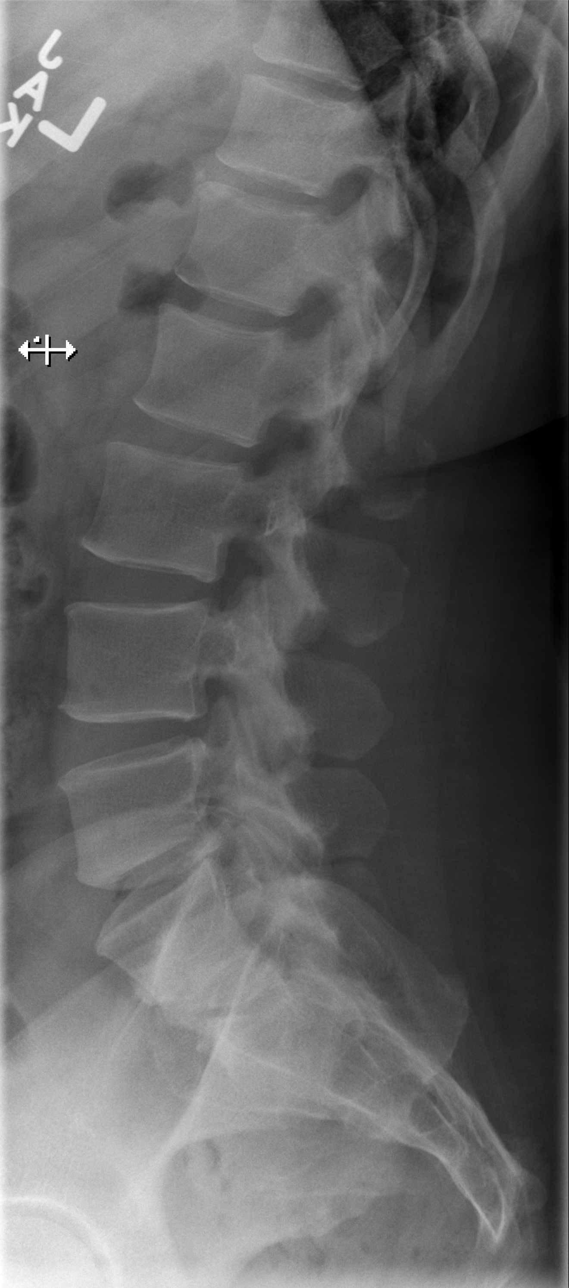

[t lumbar l-5 s-1 spot]
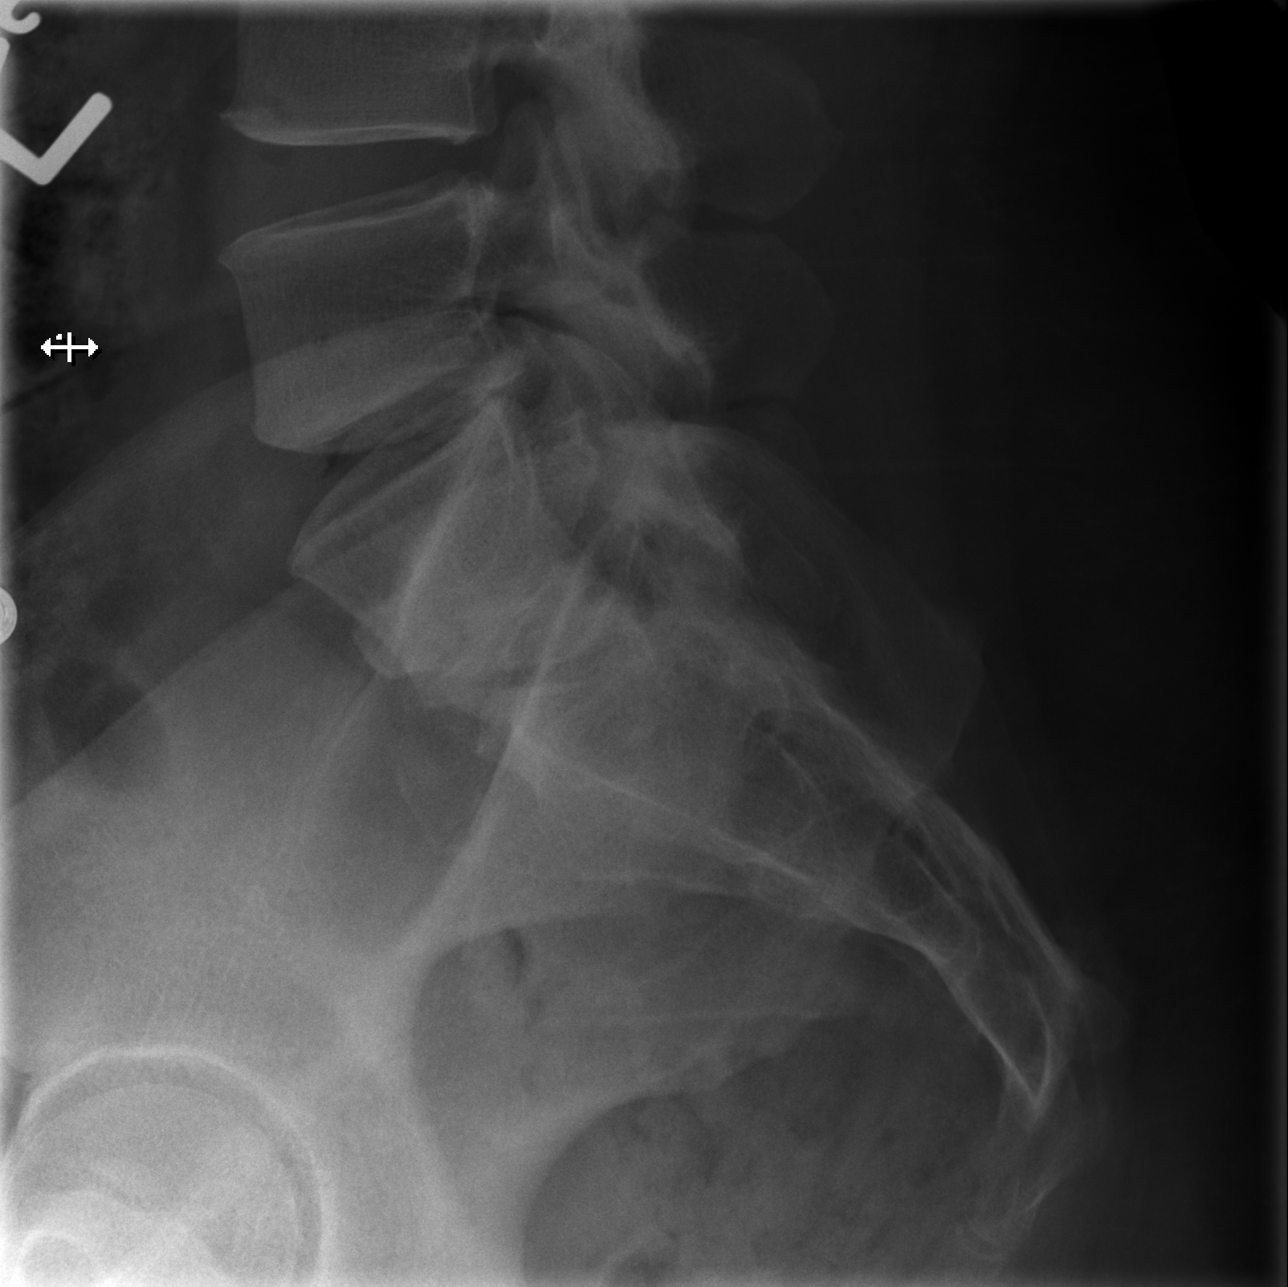

[5 of 5 positions shown; findings below may reference images not displayed]

FINDINGS: There are 5 non rib-bearing lumbar type vertebral bodies with
partial sacralization of L5. For the purposes of this dictation,
lumbar levels to be labeled L1 through L5.

Normal alignment of the lumbar spine. No anterolisthesis or
retrolisthesis. No definite pars defects.

Lumbar vertebral body heights are preserved. There is mild DDD of
T11-T12 with disc space height loss, endplate irregularity and
sclerosis. Remaining vertebral body heights appear preserved.

Limited visualization the bilateral SI joints is normal. Regional
soft tissues appear normal.
IMPRESSION: 1. No acute findings.
2. Transitional anatomy with partial sacralization of L5.
3. Mild DDD of T11-T12.

## 2015-11-01 ENCOUNTER — Ambulatory Visit: Payer: 59 | Admitting: Certified Nurse Midwife

## 2016-03-01 ENCOUNTER — Encounter (HOSPITAL_COMMUNITY): Payer: Self-pay | Admitting: Emergency Medicine

## 2016-03-01 ENCOUNTER — Ambulatory Visit (HOSPITAL_COMMUNITY)
Admission: EM | Admit: 2016-03-01 | Discharge: 2016-03-01 | Disposition: A | Discharge status: Home / Self Care | Payer: 59 | Attending: Physician Assistant | Admitting: Physician Assistant

## 2016-03-01 DIAGNOSIS — L309 Dermatitis, unspecified: Secondary | ICD-10-CM | POA: Diagnosis not present

## 2016-03-01 MED ORDER — METHYLPREDNISOLONE ACETATE 80 MG/ML IJ SUSP
80.0000 mg | Freq: Once | INTRAMUSCULAR | Status: AC
  Administered 2016-03-01: 80 mg via INTRAMUSCULAR

## 2016-03-01 MED ORDER — PREDNISONE 10 MG PO TABS: 20.0000 mg | ORAL_TABLET | Freq: Every day | ORAL | 0 refills | Status: AC

## 2016-03-01 MED ORDER — METHYLPREDNISOLONE ACETATE 80 MG/ML IJ SUSP
INTRAMUSCULAR | Status: AC
  Filled 2016-03-01: qty 1

## 2016-03-01 NOTE — ED Triage Notes (Signed)
The patient presented to the [facility] with a complaint of a rash on her face and head x 2 days.
